# Patient Record
Sex: Male | Born: 1975
Health system: Southern US, Community
[De-identification: ages and names within clinical notes are randomized; demographics above are authoritative.]

## PROBLEM LIST (undated history)

## (undated) DIAGNOSIS — I10 Essential (primary) hypertension: Secondary | ICD-10-CM

---

## 2009-11-17 ENCOUNTER — Emergency Department: Payer: Self-pay | Admitting: Emergency Medicine

## 2009-11-20 ENCOUNTER — Emergency Department: Payer: Self-pay | Admitting: Emergency Medicine

## 2011-05-11 ENCOUNTER — Emergency Department (HOSPITAL_COMMUNITY)
Admission: EM | Admit: 2011-05-11 | Discharge: 2011-05-11 | Disposition: A | Discharge status: Home / Self Care | Payer: PRIVATE HEALTH INSURANCE | Attending: Emergency Medicine | Admitting: Emergency Medicine

## 2011-05-11 DIAGNOSIS — M25469 Effusion, unspecified knee: Secondary | ICD-10-CM | POA: Insufficient documentation

## 2011-05-11 DIAGNOSIS — M25569 Pain in unspecified knee: Secondary | ICD-10-CM | POA: Insufficient documentation

## 2011-06-07 ENCOUNTER — Emergency Department (HOSPITAL_COMMUNITY)
Admission: EM | Admit: 2011-06-07 | Discharge: 2011-06-08 | Disposition: A | Discharge status: Home / Self Care | Payer: PRIVATE HEALTH INSURANCE | Attending: Emergency Medicine | Admitting: Emergency Medicine

## 2011-06-07 ENCOUNTER — Encounter: Payer: Self-pay | Admitting: Emergency Medicine

## 2011-06-07 ENCOUNTER — Emergency Department (HOSPITAL_COMMUNITY): Payer: PRIVATE HEALTH INSURANCE

## 2011-06-07 DIAGNOSIS — S60229A Contusion of unspecified hand, initial encounter: Secondary | ICD-10-CM | POA: Insufficient documentation

## 2011-06-07 DIAGNOSIS — M79609 Pain in unspecified limb: Secondary | ICD-10-CM | POA: Insufficient documentation

## 2011-06-07 DIAGNOSIS — M7989 Other specified soft tissue disorders: Secondary | ICD-10-CM | POA: Insufficient documentation

## 2011-06-07 DIAGNOSIS — IMO0002 Reserved for concepts with insufficient information to code with codable children: Secondary | ICD-10-CM | POA: Insufficient documentation

## 2011-06-07 DIAGNOSIS — S60221A Contusion of right hand, initial encounter: Secondary | ICD-10-CM

## 2011-06-07 MED ORDER — IBUPROFEN 800 MG PO TABS
800.0000 mg | ORAL_TABLET | Freq: Once | ORAL | Status: AC
  Administered 2011-06-07: 800 mg via ORAL
  Filled 2011-06-07: qty 1

## 2011-06-07 NOTE — ED Notes (Signed)
PT. REPORTS RIGHT HAND PAIN -PUNCHED A WALL THIS EVENING , PRESENTS WITH PAIN AND SWELLING.

## 2011-06-07 NOTE — ED Provider Notes (Signed)
History     CSN: [id number] Arrival date & time: 06/07/2011 11:11 PM   First MD Initiated Contact with Patient 06/07/11 2341      Chief Complaint  Patient presents with  . Hand Pain    (Consider location/radiation/quality/duration/timing/severity/associated sxs/prior treatment) HPI Patient presents with a complaint of pain in his right hand after punching a wall tonight. He denies hitting another person and does not have any bleeding or abrasion to the hand. The injury happened earlier tonight. He has not tried anything for the pain. He denies any other injury. Palpation and movement makes his pain worse. He does not have any numbness and is able to move all his fingers. There no other associated systemic symptoms.  History reviewed. No pertinent past medical history.  History reviewed. No pertinent past surgical history.  No family history on file.  History  Substance Use Topics  . Smoking status: Not on file  . Smokeless tobacco: Not on file  . Alcohol Use: Not on file      Review of Systems ROS reviewed and otherwise negative except for mentioned in HPI  Allergies  Sulfa antibiotics  Home Medications   Current Outpatient Rx  Name Route Sig Dispense Refill  . OMEGA-3 FATTY ACIDS 1000 MG PO CAPS Oral Take 1 g by mouth 2 (two) times daily.        BP 183/107  Pulse 101  Temp(Src) 98.2 F (36.8 C) (Oral)  Resp 20  SpO2 98% Vitals reviewed- hypertensive, tachycardic Physical Exam Physical Examination: General appearance - alert, well appearing, and in no distress Mental status - alert, oriented to person, place, and time Neck - supple, no midline tenderness Chest - clear to auscultation, no wheezes, rales or rhonchi, symmetric air entry Heart - normal rate, regular rhythm, normal S1, S2, no murmurs, rubs, clicks or gallops Abdomen - soft, nontender, nondistended Back exam - no midline tenderness to palpation Neurological - sensation intact distally, FROM of  fingers in flexion and extension Musculoskeletal - ttp over 5th metatarsal diffusely with soft tissue swelling, no ttp over wrist, no snuffbox tenderness, no ttp and FROM of elbow Extremities - peripheral pulses normal, no deformity Skin - normal coloration and turgor, no abrasions, no ecchymosis or lacerations  ED Course  Procedures (including critical care time)  Labs Reviewed - No data to display Dg Hand Complete Right  06/07/2011  *RADIOLOGY REPORT*  Clinical Data: Right hand pain, swelling.  RIGHT HAND - COMPLETE 3+ VIEW  Comparison: None.  Findings: There is soft tissue swelling overlying the ulnar aspect of the hand at the level of the metacarpals.  Radial and volar angulation of the distal aspect fifth metacarpal.  No acute fracture identified.  No dislocation.  IMPRESSION: Soft tissue swelling along the ulnar aspect of the hand.  Remote appearing fifth metacarpal boxer fracture.  No acute fracture or dislocation identified.  Original Report Authenticated By: Waneta Martins, M.D.     1. Contusion of right hand       MDM  Patient with tenderness and swelling over his right fifth metametacarpal after punching a wall. X-rays revealed a remote fracture of the fifth metacarpal which patient agrees occurred approximately one year ago. There is no new fracture on x-ray. X-ray was reviewed by me as well. Fingers and hand are distally neurovascularly intact. Recommended ice, elevation, ibuprofen for discomfort. Patient given strict return precautions and he is agreeable with this plan. He was notified of his elevated blood pressure and advised to  followup with his primary care doctor to have his blood pressure rechecked.       Ethelda Chick, MD 06/08/11 [phone]

## 2011-06-08 NOTE — ED Notes (Signed)
Pt shows no neuro deficits

## 2012-10-13 IMAGING — CR DG HAND COMPLETE 3+V*R*
3 series · 3 of 3 positions shown · non-contrast
Comparison: None.

CLINICAL DATA: Right hand pain, swelling.

RIGHT HAND - COMPLETE 3+ VIEW

[x hand pa right]
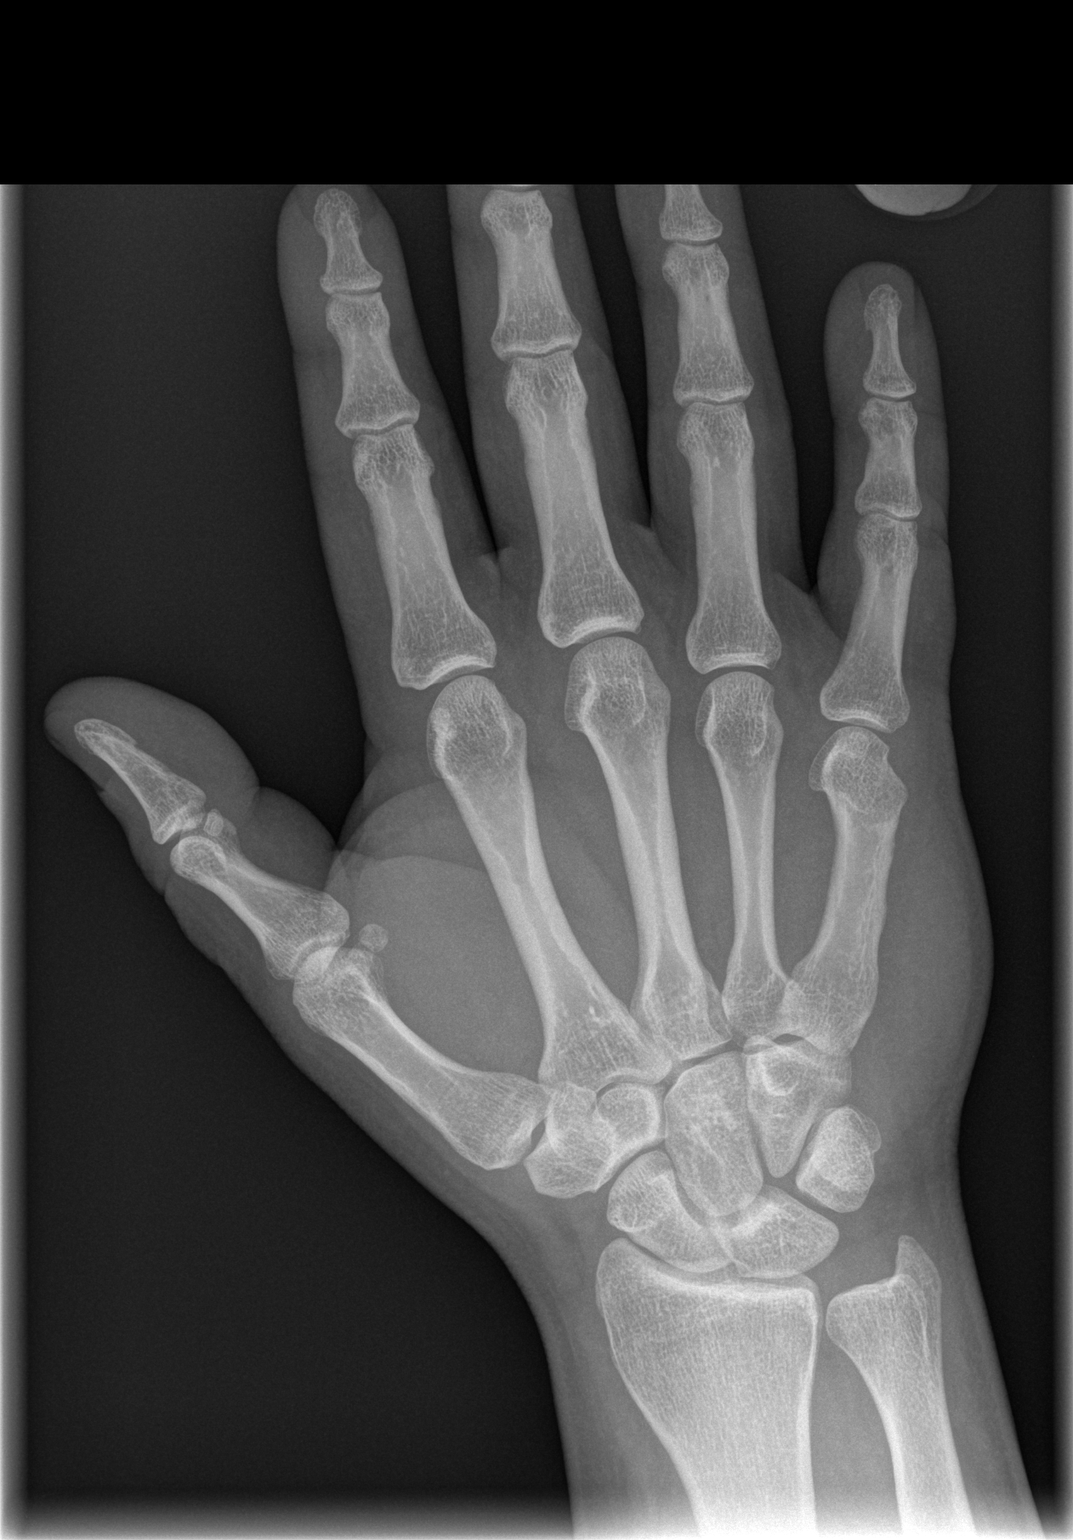

[x hand oblique right]
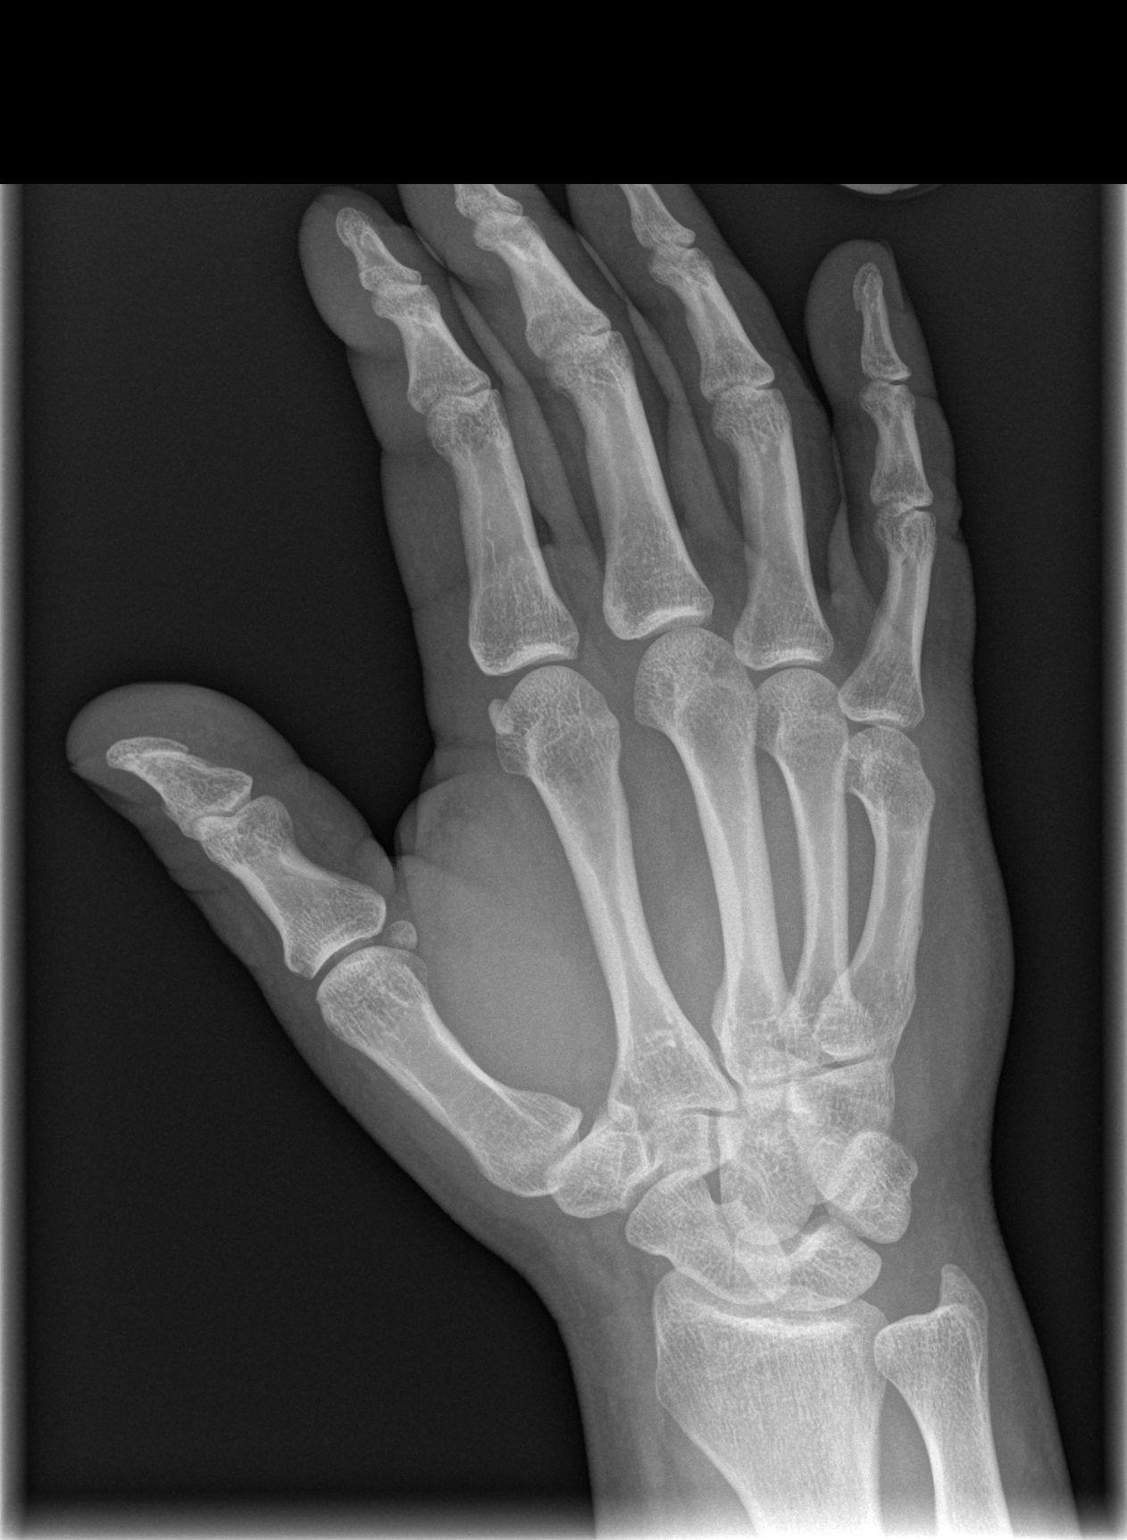

[x hand lat right]
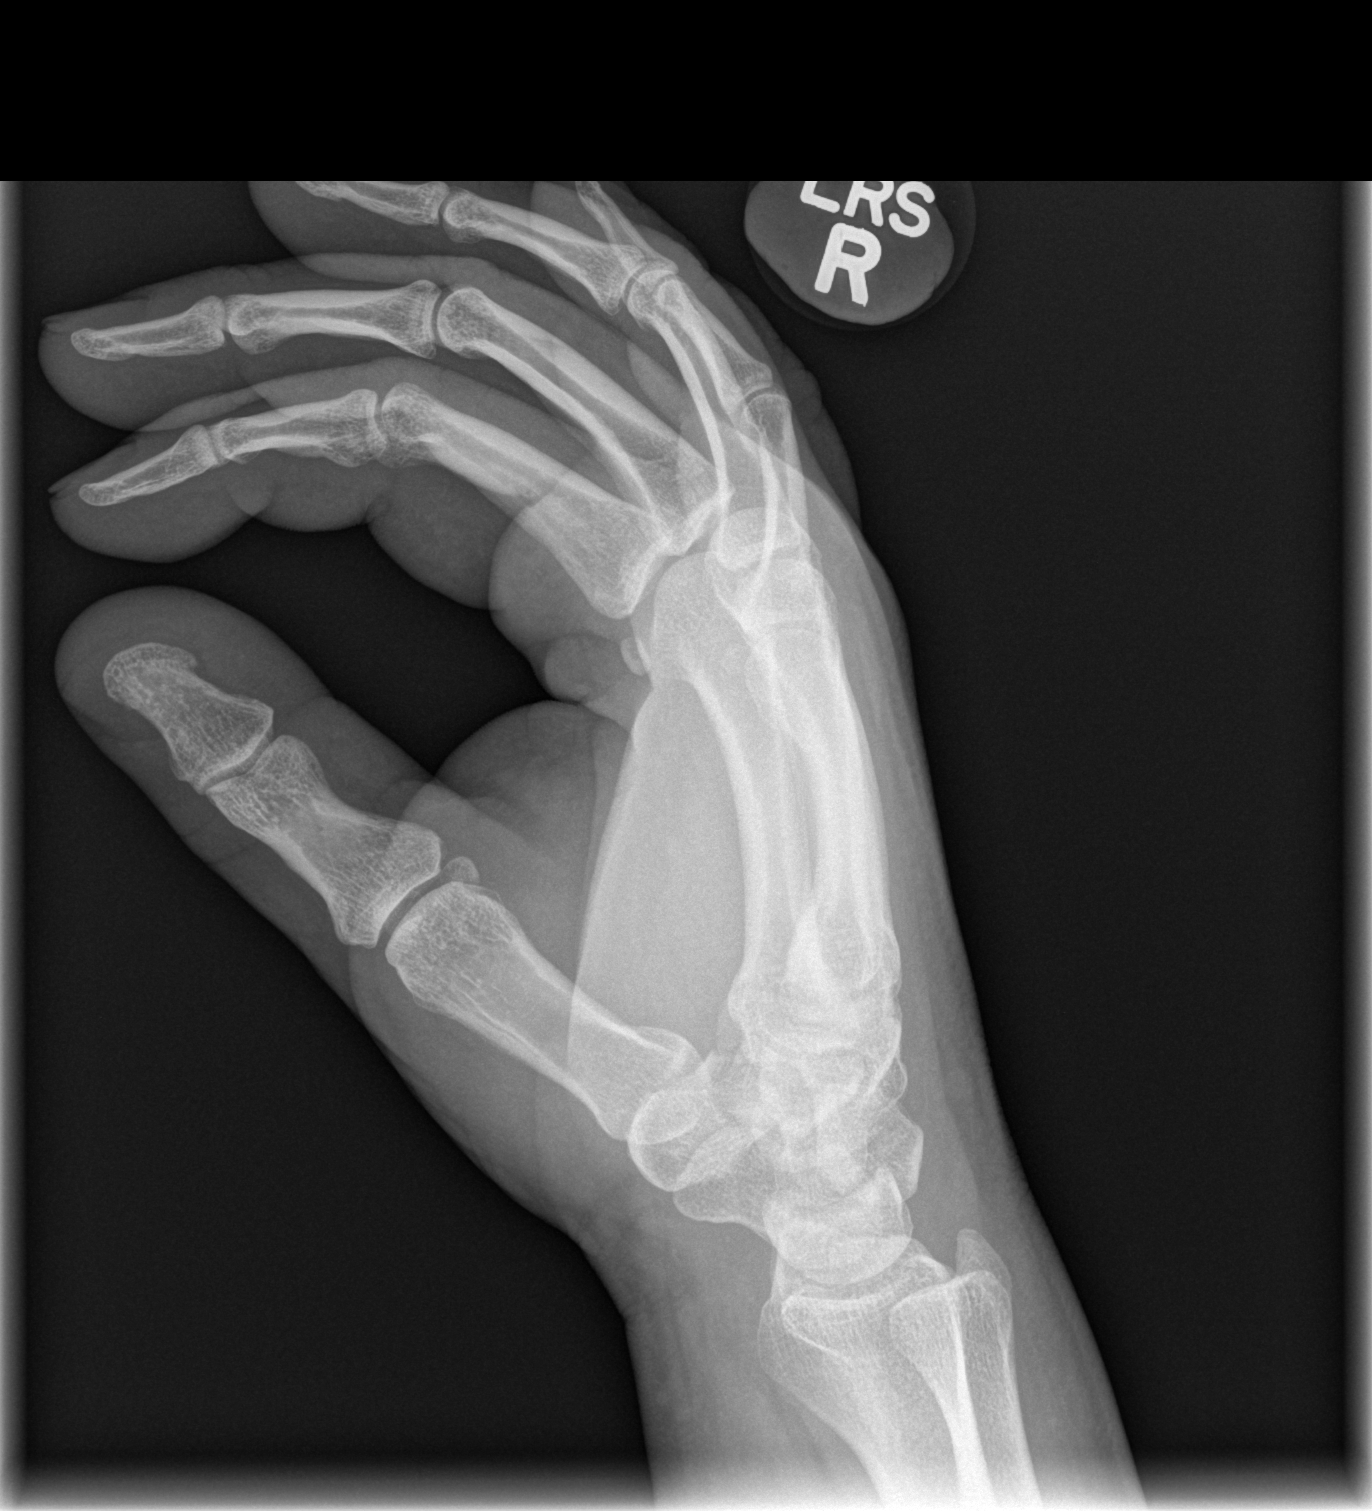

[3 of 3 positions shown; findings below may reference images not displayed]

FINDINGS: There is soft tissue swelling overlying the ulnar aspect
of the hand at the level of the metacarpals.  Radial and volar
angulation of the distal aspect fifth metacarpal.  No acute
fracture identified.  No dislocation.
IMPRESSION: Soft tissue swelling along the ulnar aspect of the
hand.

Remote appearing fifth metacarpal boxer fracture.  No acute
fracture or dislocation identified.

## 2012-12-24 ENCOUNTER — Ambulatory Visit
Admission: RE | Admit: 2012-12-24 | Discharge: 2012-12-24 | Disposition: A | Discharge status: Home / Self Care | Payer: BC Managed Care – PPO | Source: Ambulatory Visit | Attending: Physician Assistant | Admitting: Physician Assistant

## 2012-12-24 ENCOUNTER — Other Ambulatory Visit: Payer: Self-pay | Admitting: Physician Assistant

## 2012-12-24 DIAGNOSIS — M2550 Pain in unspecified joint: Secondary | ICD-10-CM

## 2013-11-23 ENCOUNTER — Encounter (HOSPITAL_COMMUNITY): Payer: Self-pay | Admitting: Emergency Medicine

## 2013-11-23 ENCOUNTER — Emergency Department (HOSPITAL_COMMUNITY)
Admission: EM | Admit: 2013-11-23 | Discharge: 2013-11-23 | Disposition: A | Discharge status: Home / Self Care | Payer: BC Managed Care – PPO | Attending: Emergency Medicine | Admitting: Emergency Medicine

## 2013-11-23 DIAGNOSIS — I10 Essential (primary) hypertension: Secondary | ICD-10-CM | POA: Insufficient documentation

## 2013-11-23 DIAGNOSIS — Z79899 Other long term (current) drug therapy: Secondary | ICD-10-CM | POA: Insufficient documentation

## 2013-11-23 DIAGNOSIS — L03119 Cellulitis of unspecified part of limb: Principal | ICD-10-CM

## 2013-11-23 DIAGNOSIS — L0291 Cutaneous abscess, unspecified: Secondary | ICD-10-CM

## 2013-11-23 DIAGNOSIS — L02419 Cutaneous abscess of limb, unspecified: Secondary | ICD-10-CM | POA: Insufficient documentation

## 2013-11-23 DIAGNOSIS — F17200 Nicotine dependence, unspecified, uncomplicated: Secondary | ICD-10-CM | POA: Insufficient documentation

## 2013-11-23 HISTORY — DX: Essential (primary) hypertension: I10

## 2013-11-23 MED ORDER — ONDANSETRON HCL 4 MG PO TABS
4.0000 mg | ORAL_TABLET | Freq: Once | ORAL | Status: AC
  Administered 2013-11-23: 4 mg via ORAL
  Filled 2013-11-23: qty 1

## 2013-11-23 MED ORDER — IBUPROFEN 800 MG PO TABS
800.0000 mg | ORAL_TABLET | Freq: Once | ORAL | Status: AC
  Administered 2013-11-23: 800 mg via ORAL
  Filled 2013-11-23: qty 1

## 2013-11-23 MED ORDER — DOXYCYCLINE HYCLATE 100 MG PO CAPS: 100.0000 mg | ORAL_CAPSULE | Freq: Two times a day (BID) | ORAL | Status: AC

## 2013-11-23 MED ORDER — IBUPROFEN 800 MG PO TABS
800.0000 mg | ORAL_TABLET | Freq: Three times a day (TID) | ORAL | Status: DC
Start: 2013-11-23 — End: 2019-08-17

## 2013-11-23 MED ORDER — HYDROCODONE-ACETAMINOPHEN 5-325 MG PO TABS
2.0000 | ORAL_TABLET | Freq: Once | ORAL | Status: AC
  Administered 2013-11-23: 2 via ORAL
  Filled 2013-11-23: qty 2

## 2013-11-23 MED ORDER — HYDROCODONE-ACETAMINOPHEN 5-325 MG PO TABS: ORAL_TABLET | ORAL | Status: DC

## 2013-11-23 MED ORDER — DOXYCYCLINE HYCLATE 100 MG PO TABS
100.0000 mg | ORAL_TABLET | Freq: Once | ORAL | Status: AC
  Administered 2013-11-23: 100 mg via ORAL
  Filled 2013-11-23: qty 1

## 2013-11-23 NOTE — ED Notes (Signed)
Pt with abscess to R groin area. States it has started to drain some.

## 2013-11-23 NOTE — Discharge Instructions (Signed)
Please soak in a tub of warm Epsom salt water 2 times daily. Please use doxycycline and ibuprofen 800 mg daily. Use Norco for pain if needed. Norco may cause drowsiness, please use with caution. Please see your primary physician, or return to the emergency department if the abscess is getting larger, or there is fever that is not controlled by ibuprofen or Tylenol. Abscess An abscess (boil or furuncle) is an infected area on or under the skin. This area is filled with yellowish-white fluid (pus) and other material (debris). HOME CARE   Only take medicines as told by your doctor.  If you were given antibiotic medicine, take it as directed. Finish the medicine even if you start to feel better.  If gauze is used, follow your doctor's directions for changing the gauze.  To avoid spreading the infection:  Keep your abscess covered with a bandage.  Wash your hands well.  Do not share personal care items, towels, or whirlpools with others.  Avoid skin contact with others.  Keep your skin and clothes clean around the abscess.  Keep all doctor visits as told. GET HELP RIGHT AWAY IF:   You have more pain, puffiness (swelling), or redness in the wound site.  You have more fluid or blood coming from the wound site.  You have muscle aches, chills, or you feel sick.  You have a fever. MAKE SURE YOU:   Understand these instructions.  Will watch your condition.  Will get help right away if you are not doing well or get worse. Document Released: 12/19/2007 Document Revised: 01/01/2012 Document Reviewed: 09/14/2011 [facility] Patient Information 2014 [employer], [place].

## 2013-11-23 NOTE — ED Provider Notes (Signed)
CSN: [id number]     Arrival date & time 11/23/13  1907 History   First MD Initiated Contact with Patient 11/23/13 2118     Chief Complaint  Patient presents with  . Abscess     (Consider location/radiation/quality/duration/timing/severity/associated sxs/prior Treatment) Patient is a 38 y.o. male presenting with abscess. The history is provided by the patient.  Abscess Location:  Leg Leg abscess location:  L leg Abscess quality: painful, redness and warmth   Abscess quality: not draining and not weeping   Red streaking: no   Duration:  3 days Progression:  Worsening Pain details:    Quality:  Aching   Severity:  Moderate   Duration:  3 days   Timing:  Intermittent   Progression:  Worsening Chronicity:  New Context: not diabetes and not insect bite/sting   Relieved by:  Nothing Worsened by:  Nothing tried Ineffective treatments:  Warm compresses Associated symptoms: no fatigue, no fever, no nausea and no vomiting   Risk factors: prior abscess     Past Medical History  Diagnosis Date  . Hypertension    History reviewed. No pertinent past surgical history. History reviewed. No pertinent family history. History  Substance Use Topics  . Smoking status: Current Every Day Smoker  . Smokeless tobacco: Not on file  . Alcohol Use: Yes    Review of Systems  Constitutional: Negative for fever, activity change and fatigue.       All ROS Neg except as noted in HPI  HENT: Negative for nosebleeds.   Eyes: Negative for photophobia and discharge.  Respiratory: Negative for cough, shortness of breath and wheezing.   Cardiovascular: Negative for chest pain and palpitations.  Gastrointestinal: Negative for nausea, vomiting, abdominal pain and blood in stool.  Genitourinary: Negative for dysuria, frequency and hematuria.  Musculoskeletal: Negative for arthralgias, back pain and neck pain.  Skin: Negative.   Neurological: Negative for dizziness, seizures and speech difficulty.    Psychiatric/Behavioral: Negative for hallucinations and confusion.      Allergies  Sulfa antibiotics  Home Medications   Prior to Admission medications   Medication Sig Start Date End Date Taking? Authorizing Provider  fish oil-omega-3 fatty acids 1000 MG capsule Take 1 g by mouth 2 (two) times daily.      Historical Provider, MD   BP 121/73  Pulse 86  Temp(Src) 98.8 F (37.1 C) (Oral)  Resp 20  Ht 5\' 11"  (1.803 m)  Wt 275 lb (124.739 kg)  BMI 38.37 kg/m2  SpO2 98% Physical Exam  Nursing note and vitals reviewed. Constitutional: He is oriented to person, place, and time. He appears well-developed and well-nourished.  Non-toxic appearance.  HENT:  Head: Normocephalic.  Right Ear: Tympanic membrane and external ear normal.  Left Ear: Tympanic membrane and external ear normal.  Eyes: EOM and lids are normal. Pupils are equal, round, and reactive to light.  Neck: Normal range of motion. Neck supple. Carotid bruit is not present.  Cardiovascular: Normal rate, regular rhythm, normal heart sounds, intact distal pulses and normal pulses.   Pulmonary/Chest: Breath sounds normal. No respiratory distress.  Abdominal: Soft. Bowel sounds are normal. There is no tenderness. There is no guarding.  Genitourinary:     No scrotal or rectal involvement from the abscess.  Musculoskeletal: Normal range of motion.  Lymphadenopathy:       Head (right side): No submandibular adenopathy present.       Head (left side): No submandibular adenopathy present.    He has no cervical  adenopathy.  Neurological: He is alert and oriented to person, place, and time. He has normal strength. No cranial nerve deficit or sensory deficit.  Skin: Skin is warm and dry.  Psychiatric: He has a normal mood and affect. His speech is normal.    ED Course  Procedures (including critical care time) Labs Review Labs Reviewed - No data to display  Imaging Review No results found.   EKG Interpretation None       MDM Pt has an abscess of the left inner thigh. No red streaking. No fever. This abscess is not a candidate for I and D. Rx for doxycycline, norco, and ibuprofen given to the patient. Pt to return the abscess is progressing.   Final diagnoses:  None    **I have reviewed nursing notes, vital signs, and all appropriate lab and imaging results for this patient.Kathie Dike*    Treva Huyett M Rebekah Zackery, PA-C 11/25/13 1932

## 2013-11-23 NOTE — ED Notes (Signed)
Abscess to lt thigh for 3 days

## 2013-11-25 NOTE — ED Notes (Signed)
Pt called and stated he needs a work note. Given a note to return 11/25/13

## 2013-11-26 NOTE — ED Provider Notes (Signed)
Medical screening examination/treatment/procedure(s) were performed by non-physician practitioner and as supervising physician I was immediately available for consultation/collaboration.   EKG Interpretation None       Yatziri Wainwright, MD 11/26/13 0207 

## 2018-06-26 ENCOUNTER — Other Ambulatory Visit: Payer: Self-pay

## 2018-06-26 ENCOUNTER — Encounter (HOSPITAL_COMMUNITY): Payer: Self-pay

## 2018-06-26 ENCOUNTER — Emergency Department (HOSPITAL_COMMUNITY)
Admission: EM | Admit: 2018-06-26 | Discharge: 2018-06-26 | Disposition: A | Discharge status: Home / Self Care | Payer: Self-pay | Attending: Emergency Medicine | Admitting: Emergency Medicine

## 2018-06-26 DIAGNOSIS — F17210 Nicotine dependence, cigarettes, uncomplicated: Secondary | ICD-10-CM | POA: Insufficient documentation

## 2018-06-26 DIAGNOSIS — Y929 Unspecified place or not applicable: Secondary | ICD-10-CM | POA: Insufficient documentation

## 2018-06-26 DIAGNOSIS — Y999 Unspecified external cause status: Secondary | ICD-10-CM | POA: Insufficient documentation

## 2018-06-26 DIAGNOSIS — S6710XA Crushing injury of unspecified finger(s), initial encounter: Secondary | ICD-10-CM

## 2018-06-26 DIAGNOSIS — I10 Essential (primary) hypertension: Secondary | ICD-10-CM | POA: Insufficient documentation

## 2018-06-26 DIAGNOSIS — S67194A Crushing injury of right ring finger, initial encounter: Secondary | ICD-10-CM | POA: Insufficient documentation

## 2018-06-26 DIAGNOSIS — Y939 Activity, unspecified: Secondary | ICD-10-CM | POA: Insufficient documentation

## 2018-06-26 DIAGNOSIS — W231XXA Caught, crushed, jammed, or pinched between stationary objects, initial encounter: Secondary | ICD-10-CM | POA: Insufficient documentation

## 2018-06-26 DIAGNOSIS — S61214A Laceration without foreign body of right ring finger without damage to nail, initial encounter: Secondary | ICD-10-CM | POA: Insufficient documentation

## 2018-06-26 MED ORDER — LIDOCAINE HCL (PF) 1 % IJ SOLN
5.0000 mL | Freq: Once | INTRAMUSCULAR | Status: AC
  Administered 2018-06-26: 5 mL
  Filled 2018-06-26: qty 5

## 2018-06-26 MED ORDER — BUPIVACAINE HCL (PF) 0.5 % IJ SOLN
10.0000 mL | Freq: Once | INTRAMUSCULAR | Status: AC
  Administered 2018-06-26: 10 mL
  Filled 2018-06-26: qty 10

## 2018-06-26 NOTE — ED Notes (Signed)
Pt is soaking finger in sterile water / hydrogen peroxide solution.

## 2018-06-26 NOTE — ED Notes (Signed)
ED Provider at bedside. 

## 2018-06-26 NOTE — ED Provider Notes (Signed)
[facility] Provider Note   CSN: [id number] Arrival date & time: 06/26/18  1557     History   Chief Complaint Chief Complaint  Patient presents with  . Finger Injury    HPI Ivan Mcmahon is a 42 y.o. male.  He is complaining of an injury to his right ring finger when a broom broke and pinched his finger causing a tear in the pad of the skin.  This occurred just prior to arrival.  No foreign body sensation.  Tetanus is up-to-date.  He is complaining of moderate amount of pain once palpated but otherwise does not feel like it is broken.  No other injuries or complaints.  The history is provided by the patient.  Hand Pain  This is a new problem. The current episode started 1 to 2 hours ago. The problem occurs constantly. The problem has not changed since onset.Pertinent negatives include no chest pain, no abdominal pain, no headaches and no shortness of breath. The symptoms are aggravated by bending. The symptoms are relieved by position. He has tried nothing for the symptoms. The treatment provided no relief.    Past Medical History:  Diagnosis Date  . Hypertension     There are no active problems to display for this patient.   History reviewed. No pertinent surgical history.      Home Medications    Prior to Admission medications   Medication Sig Start Date End Date Taking? Authorizing Provider  fish oil-omega-3 fatty acids 1000 MG capsule Take 1 g by mouth 2 (two) times daily.      [provider]  HYDROcodone-acetaminophen (NORCO/VICODIN) 5-325 MG per tablet 1 or 2 po q4h prn pain 11/23/13   Ivery QualeBryant, Hobson, PA-C  ibuprofen (ADVIL,MOTRIN) 800 MG tablet Take 1 tablet (800 mg total) by mouth 3 (three) times daily. 11/23/13   Ivery QualeBryant, Hobson, PA-C    Family History No family history on file.  Social History Social History   Tobacco Use  . Smoking status: Current Every Day Smoker  Substance Use Topics  . Alcohol use: Yes    . Drug use: No     Allergies   Sulfa antibiotics   Review of Systems Review of Systems  Respiratory: Negative for shortness of breath.   Cardiovascular: Negative for chest pain.  Gastrointestinal: Negative for abdominal pain.  Skin: Positive for wound.  Neurological: Negative for numbness and headaches.     Physical Exam Updated Vital Signs BP (!) 179/120 (BP Location: Left Arm)   Pulse (!) 108   Temp 97.9 F (36.6 C) (Oral)   Resp 18   SpO2 98%   Physical Exam Vitals signs and nursing note reviewed.  Constitutional:      Appearance: He is well-developed.  HENT:     Head: Normocephalic and atraumatic.  Eyes:     Conjunctiva/sclera: Conjunctivae normal.  Neck:     Musculoskeletal: Neck supple.  Pulmonary:     Effort: Pulmonary effort is normal.  Musculoskeletal:     Comments: He is an injury to his right ring finger.  FDS FDP and EDC intact.  Cap refill brisk.  Sensation to light touch intact.  He is got a flap laceration over the distal phalanx pad side  Skin:    General: Skin is warm and dry.  Neurological:     Mental Status: He is alert.     GCS: GCS eye subscore is 4. GCS verbal subscore is 5. GCS motor subscore is  6.      ED Treatments / Results  Labs (all labs ordered are listed, but only abnormal results are displayed) Labs Reviewed - No data to display  EKG None  Radiology No results found.  Procedures .Marland KitchenLaceration Repair Date/Time: 06/26/2018 6:19 PM Performed by: Terrilee Files, MD Authorized by: Terrilee Files, MD   Consent:    Consent obtained:  Verbal   Consent given by:  Patient   Risks discussed:  Infection, pain, poor cosmetic result, poor wound healing and retained foreign body   Alternatives discussed:  No treatment and delayed treatment Anesthesia (see MAR for exact dosages):    Anesthesia method:  Nerve block   Block needle gauge:  25 G   Block anesthetic:  Lidocaine 1% w/o epi and bupivacaine 0.5% w/o epi   Block  technique:  Digital   Block outcome:  Incomplete block Laceration details:    Location:  Finger   Finger location:  R ring finger   Length (cm):  3 Comments:     Unable to achieve enough anesthesia to proceed with suture repair.  Patient ultimately asked to just have it butterflied so he can go.   (including critical care time)  Medications Ordered in ED Medications - No data to display   Initial Impression / Assessment and Plan / ED Course  I have reviewed the triage vital signs and the nursing notes.  Pertinent labs & imaging results that were available during my care of the patient were reviewed by me and considered in my medical decision making (see chart for details).      Final Clinical Impressions(s) / ED Diagnoses   Final diagnoses:  Crushing injury of finger, initial encounter  Laceration of right ring finger without foreign body without damage to nail, initial encounter  Hypertension, unspecified type    ED Discharge Orders    None       Terrilee Files, MD 06/27/18 1048

## 2018-06-26 NOTE — Discharge Instructions (Addendum)
Seen in the emergency department for laceration to your ring finger.  We tried to numb it up enough to do stitches but it never really worked.  We applied some butterfly stitches and you will need to keep this area clean and dry.  You can wash it with soap and water.  Watch for signs of infection.  Tylenol and ibuprofen for pain.  Your blood pressure was also elevated here and will be important for you to get this rechecked by your primary care doctor.

## 2018-06-26 NOTE — ED Triage Notes (Signed)
Pt reports a broom breaking while sweeping and cutting his left ring finger, bleeding controlled at this time. UTD on tetanus

## 2018-11-21 DIAGNOSIS — H20042 Secondary noninfectious iridocyclitis, left eye: Secondary | ICD-10-CM | POA: Diagnosis not present

## 2018-11-21 DIAGNOSIS — S0502XA Injury of conjunctiva and corneal abrasion without foreign body, left eye, initial encounter: Secondary | ICD-10-CM | POA: Diagnosis not present

## 2018-11-21 DIAGNOSIS — H5712 Ocular pain, left eye: Secondary | ICD-10-CM | POA: Diagnosis not present

## 2018-11-24 DIAGNOSIS — S0502XA Injury of conjunctiva and corneal abrasion without foreign body, left eye, initial encounter: Secondary | ICD-10-CM | POA: Diagnosis not present

## 2018-11-24 DIAGNOSIS — H5712 Ocular pain, left eye: Secondary | ICD-10-CM | POA: Diagnosis not present

## 2018-11-24 DIAGNOSIS — H20042 Secondary noninfectious iridocyclitis, left eye: Secondary | ICD-10-CM | POA: Diagnosis not present

## 2019-07-21 ENCOUNTER — Ambulatory Visit: Payer: Self-pay | Admitting: Family Medicine

## 2019-08-10 ENCOUNTER — Telehealth: Payer: Self-pay

## 2019-08-10 NOTE — Telephone Encounter (Signed)
Patient was not able to come at the other times and he forgot to ask off from work for this appointment so rescheduled patient to a different day

## 2019-08-10 NOTE — Telephone Encounter (Signed)
Left message on voicemail to see if pt could come in at 10:45 or 3:45 instead of 12:15 due to slot not being appropriate for New pt.... for now I will block the 10:45am

## 2019-08-11 ENCOUNTER — Ambulatory Visit: Payer: Self-pay | Admitting: Internal Medicine

## 2019-08-17 ENCOUNTER — Other Ambulatory Visit: Payer: Self-pay

## 2019-08-17 ENCOUNTER — Ambulatory Visit (INDEPENDENT_AMBULATORY_CARE_PROVIDER_SITE_OTHER): Payer: BC Managed Care – PPO | Admitting: Internal Medicine

## 2019-08-17 ENCOUNTER — Encounter: Payer: Self-pay | Admitting: Internal Medicine

## 2019-08-17 VITALS — BP 178/108 | HR 100 | Temp 97.9°F | Wt 304.0 lb

## 2019-08-17 DIAGNOSIS — I10 Essential (primary) hypertension: Secondary | ICD-10-CM | POA: Diagnosis not present

## 2019-08-17 DIAGNOSIS — Z716 Tobacco abuse counseling: Secondary | ICD-10-CM

## 2019-08-17 DIAGNOSIS — M1A0790 Idiopathic chronic gout, unspecified ankle and foot, without tophus (tophi): Secondary | ICD-10-CM

## 2019-08-17 DIAGNOSIS — Z833 Family history of diabetes mellitus: Secondary | ICD-10-CM | POA: Diagnosis not present

## 2019-08-17 DIAGNOSIS — Z6841 Body Mass Index (BMI) 40.0 and over, adult: Secondary | ICD-10-CM

## 2019-08-17 LAB — HEMOGLOBIN A1C: Hgb A1c MFr Bld: 6.1 % (ref 4.6–6.5)

## 2019-08-17 LAB — CBC
HCT: 45.6 % (ref 39.0–52.0)
Hemoglobin: 15.2 g/dL (ref 13.0–17.0)
MCHC: 33.5 g/dL (ref 30.0–36.0)
MCV: 93.7 fl (ref 78.0–100.0)
Platelets: 343 10*3/uL (ref 150.0–400.0)
RBC: 4.87 Mil/uL (ref 4.22–5.81)
RDW: 13.3 % (ref 11.5–15.5)
WBC: 11.7 10*3/uL — ABNORMAL HIGH (ref 4.0–10.5)

## 2019-08-17 LAB — URIC ACID: Uric Acid, Serum: 8.8 mg/dL — ABNORMAL HIGH (ref 4.0–7.8)

## 2019-08-17 LAB — COMPREHENSIVE METABOLIC PANEL
ALT: 36 U/L (ref 0–53)
AST: 27 U/L (ref 0–37)
Albumin: 4.5 g/dL (ref 3.5–5.2)
Alkaline Phosphatase: 98 U/L (ref 39–117)
BUN: 13 mg/dL (ref 6–23)
CO2: 29 mEq/L (ref 19–32)
Calcium: 9.8 mg/dL (ref 8.4–10.5)
Chloride: 101 mEq/L (ref 96–112)
Creatinine, Ser: 0.92 mg/dL (ref 0.40–1.50)
GFR: 90.25 mL/min (ref >60.00)
Glucose, Bld: 113 mg/dL — ABNORMAL HIGH (ref 70–99)
Potassium: 4.8 mEq/L (ref 3.5–5.1)
Sodium: 138 mEq/L (ref 135–145)
Total Bilirubin: 0.5 mg/dL (ref 0.2–1.2)
Total Protein: 7.4 g/dL (ref 6.0–8.3)

## 2019-08-17 LAB — LDL CHOLESTEROL, DIRECT: Direct LDL: 169 mg/dL

## 2019-08-17 LAB — LIPID PANEL
Cholesterol: 244 mg/dL — ABNORMAL HIGH (ref 0–200)
HDL: 45.7 mg/dL (ref >39.00)
NonHDL: 197.88
Total CHOL/HDL Ratio: 5
Triglycerides: 301 mg/dL — ABNORMAL HIGH (ref 0.0–149.0)
VLDL: 60.2 mg/dL — ABNORMAL HIGH (ref 0.0–40.0)

## 2019-08-17 MED ORDER — CHANTIX STARTING MONTH PAK 0.5 MG X 11 & 1 MG X 42 PO TABS: ORAL_TABLET | ORAL | 0 refills | Status: DC

## 2019-08-17 MED ORDER — LOSARTAN POTASSIUM-HCTZ 50-12.5 MG PO TABS: 1.0000 | ORAL_TABLET | Freq: Every day | ORAL | 0 refills | Status: DC

## 2019-08-17 NOTE — Assessment & Plan Note (Signed)
CMET today Will start Losartan HCT Reinforced DASH diet and exercise for weight loss  Follow up in 10 days for BP check

## 2019-08-17 NOTE — Assessment & Plan Note (Signed)
Uric acid level today Consider restarting Allopurinol if uric acid elevated

## 2019-08-17 NOTE — Patient Instructions (Signed)

## 2019-08-17 NOTE — Progress Notes (Signed)
HPI  Pt presents to the clinic today to establish care and for management of the conditions listed below. He is transferring care from [place].  HTN: His BP today is 178/100. He was on Lisinopril in the past but has not taken this in a few years. He denies headaches, dizziness or visual changes.  Gout: His last flare was about 6 months. It typically flares in his feet and right wrist. He was on Allopurinol at one point but has not taken in a long time.   He would like something to quit smoking.  Flu: never Tetanus: < 10 years ago Vision Screening: as needed Dentist: as needed  Past Medical History:  Diagnosis Date  . Hypertension     Current Outpatient Medications  Medication Sig Dispense Refill  . fish oil-omega-3 fatty acids 1000 MG capsule Take 1 g by mouth 2 (two) times daily.       No current facility-administered medications for this visit.    Allergies  Allergen Reactions  . Sulfa Antibiotics     UNKNOWN REACTION MANY YEARS AGO    Family History  Problem Relation Age of Onset  . Diabetes Father     Social History   Socioeconomic History  . Marital status: Married    Spouse name: Not on file  . Number of children: Not on file  . Years of education: Not on file  . Highest education level: Not on file  Occupational History  . Not on file  Tobacco Use  . Smoking status: Current Every Day Smoker    Packs/day: 0.33    Types: Cigarettes  . Smokeless tobacco: Never Used  Substance and Sexual Activity  . Alcohol use: Yes    Comment: beers every other day  . Drug use: No  . Sexual activity: Not on file  Other Topics Concern  . Not on file  Social History Narrative  . Not on file   Social Determinants of Health   Financial Resource Strain:   . Difficulty of Paying Living Expenses: Not on file  Food Insecurity:   . Worried About Programme researcher, broadcasting/film/video in the Last Year: Not on file  . Ran Out of Food in the Last Year: Not on file  Transportation Needs:   .  Lack of Transportation (Medical): Not on file  . Lack of Transportation (Non-Medical): Not on file  Physical Activity:   . Days of Exercise per Week: Not on file  . Minutes of Exercise per Session: Not on file  Stress:   . Feeling of Stress : Not on file  Social Connections:   . Frequency of Communication with Friends and Family: Not on file  . Frequency of Social Gatherings with Friends and Family: Not on file  . Attends Religious Services: Not on file  . Active Member of Clubs or Organizations: Not on file  . Attends Banker Meetings: Not on file  . Marital Status: Not on file  Intimate Partner Violence:   . Fear of Current or Ex-Partner: Not on file  . Emotionally Abused: Not on file  . Physically Abused: Not on file  . Sexually Abused: Not on file    ROS:  Constitutional: Denies fever, malaise, fatigue, headache or abrupt weight changes.  HEENT: Denies eye pain, eye redness, ear pain, ringing in the ears, wax buildup, runny nose, nasal congestion, bloody nose, or sore throat. Respiratory: Denies difficulty breathing, shortness of breath, cough or sputum production.   Cardiovascular: Denies chest pain,  chest tightness, palpitations or swelling in the hands or feet.  Gastrointestinal: Denies abdominal pain, bloating, constipation, diarrhea or blood in the stool.  GU: Denies frequency, urgency, pain with urination, blood in urine, odor or discharge. Musculoskeletal: Denies decrease in range of motion, difficulty with gait, muscle pain or joint pain and swelling.  Skin: Denies redness, rashes, lesions or ulcercations.  Neurological: Denies dizziness, difficulty with memory, difficulty with speech or problems with balance and coordination.  Psych: Denies anxiety, depression, SI/HI.  No other specific complaints in a complete review of systems (except as listed in HPI above).  PE:  BP (!) 178/108   Pulse 100   Temp 97.9 F (36.6 C) (Temporal)   Wt (!) 304 lb  (137.9 kg)   SpO2 96%   BMI 42.40 kg/m  Wt Readings from Last 3 Encounters:  08/17/19 (!) 304 lb (137.9 kg)  11/23/13 275 lb (124.7 kg)    General: Appears his stated age, obese, in NAD. Skin: Dry and intact. HEENT: Head: normal shape and size; Eyes: sclera white, no icterus, conjunctiva pink, PERRLA and EOMs intact;  Cardiovascular: Normal rate and rhythm. S1,S2 noted.  No murmur, rubs or gallops noted.  Pulmonary/Chest: Normal effort and positive vesicular breath sounds. No respiratory distress. No wheezes, rales or ronchi noted.  Musculoskeletal: . No difficulty with gait.  Neurological: Alert and oriented.  Psychiatric: Mood and affect normal. Behavior is normal. Judgment and thought content normal.    Assessment and Plan:  Encounter for Smoking Cessation:  Will trial Chantix- discussed common side effects Encouraged him to cut back on drinking while on Chantix Encouraged him to pick a quit date and stick to it  Family Hx of DM 2:  CMET, Lipid and A1C today Encouraged exercise for weight loss  Webb Silversmith, NP This visit occurred during the SARS-CoV-2 public health emergency.  Safety protocols were in place, including screening questions prior to the visit, additional usage of staff PPE, and extensive cleaning of exam room while observing appropriate contact time as indicated for disinfecting solutions.

## 2019-08-26 ENCOUNTER — Telehealth: Payer: Self-pay | Admitting: Internal Medicine

## 2019-08-26 NOTE — Telephone Encounter (Signed)
Patient returned call about his lab results. Please call patient.

## 2019-08-27 ENCOUNTER — Other Ambulatory Visit: Payer: Self-pay

## 2019-08-27 ENCOUNTER — Encounter: Payer: Self-pay | Admitting: Internal Medicine

## 2019-08-27 ENCOUNTER — Ambulatory Visit (INDEPENDENT_AMBULATORY_CARE_PROVIDER_SITE_OTHER): Payer: BC Managed Care – PPO | Admitting: Internal Medicine

## 2019-08-27 VITALS — BP 192/112 | HR 102 | Temp 97.6°F | Wt 305.0 lb

## 2019-08-27 DIAGNOSIS — R0683 Snoring: Secondary | ICD-10-CM | POA: Diagnosis not present

## 2019-08-27 DIAGNOSIS — R5383 Other fatigue: Secondary | ICD-10-CM | POA: Diagnosis not present

## 2019-08-27 DIAGNOSIS — R6882 Decreased libido: Secondary | ICD-10-CM | POA: Diagnosis not present

## 2019-08-27 DIAGNOSIS — Z6841 Body Mass Index (BMI) 40.0 and over, adult: Secondary | ICD-10-CM

## 2019-08-27 DIAGNOSIS — I10 Essential (primary) hypertension: Secondary | ICD-10-CM | POA: Diagnosis not present

## 2019-08-27 MED ORDER — ALLOPURINOL 100 MG PO TABS: 100.0000 mg | ORAL_TABLET | Freq: Every day | ORAL | 2 refills | Status: DC

## 2019-08-27 NOTE — Progress Notes (Signed)
Subjective:    Patient ID: Ivan Mcmahon, male    DOB: [birth year], 44 y.o.   MRN: [id number]  HPI  Pt presents to the clinic today for 10 day follow up HTN. At his last visit, he was started on Losartan HCT but he reports he never picked this medication up. He does reports increased fatigue and decreased libido, not sure if this is related to high blood pressure or not. He has been under a lot of stress lately. He doesn't sleep well, averages 5 hours per sleep at night. He does snore but denies waking up coughing or gasping for air. His BP today is 192/112. There is no ECG on file.   Review of Systems      Past Medical History:  Diagnosis Date  . Hypertension     Current Outpatient Medications  Medication Sig Dispense Refill  . fish oil-omega-3 fatty acids 1000 MG capsule Take 1 g by mouth 2 (two) times daily.      Marland Kitchen losartan-hydrochlorothiazide (HYZAAR) 50-12.5 MG tablet Take 1 tablet by mouth daily. 30 tablet 0  . varenicline (CHANTIX STARTING MONTH PAK) 0.5 MG X 11 & 1 MG X 42 tablet Take one 0.5 mg tablet by mouth once daily for 3 days, then increase to one 0.5 mg tablet twice daily for 4 days, then increase to one 1 mg tablet twice daily. 53 tablet 0   No current facility-administered medications for this visit.    Allergies  Allergen Reactions  . Sulfa Antibiotics     UNKNOWN REACTION MANY YEARS AGO    Family History  Problem Relation Age of Onset  . Diabetes Father     Social History   Socioeconomic History  . Marital status: Married    Spouse name: Not on file  . Number of children: Not on file  . Years of education: Not on file  . Highest education level: Not on file  Occupational History  . Not on file  Tobacco Use  . Smoking status: Current Every Day Smoker    Packs/day: 0.33    Types: Cigarettes  . Smokeless tobacco: Never Used  Substance and Sexual Activity  . Alcohol use: Yes    Comment: beers every other day  . Drug use: No  . Sexual activity:  Not on file  Other Topics Concern  . Not on file  Social History Narrative  . Not on file   Social Determinants of Health   Financial Resource Strain:   . Difficulty of Paying Living Expenses: Not on file  Food Insecurity:   . Worried About Charity fundraiser in the Last Year: Not on file  . Ran Out of Food in the Last Year: Not on file  Transportation Needs:   . Lack of Transportation (Medical): Not on file  . Lack of Transportation (Non-Medical): Not on file  Physical Activity:   . Days of Exercise per Week: Not on file  . Minutes of Exercise per Session: Not on file  Stress:   . Feeling of Stress : Not on file  Social Connections:   . Frequency of Communication with Friends and Family: Not on file  . Frequency of Social Gatherings with Friends and Family: Not on file  . Attends Religious Services: Not on file  . Active Member of Clubs or Organizations: Not on file  . Attends Archivist Meetings: Not on file  . Marital Status: Not on file  Intimate Partner Violence:   .  Fear of Current or Ex-Partner: Not on file  . Emotionally Abused: Not on file  . Physically Abused: Not on file  . Sexually Abused: Not on file     Constitutional: Pt reports fatigue. Denies fever, malaise, headache or abrupt weight changes.  Respiratory: Denies difficulty breathing, shortness of breath, cough or sputum production.   Cardiovascular: Denies chest pain, chest tightness, palpitations or swelling in the hands or feet.  GU: Pt reports decreased libido.  Neurological: Pt reports snoring, Denies dizziness, difficulty with memory, difficulty with speech or problems with balance and coordination.  Psych: Denies anxiety, depression, SI/HI.  No other specific complaints in a complete review of systems (except as listed in HPI above).  Objective:   Physical Exam  BP (!) 192/112   Pulse (!) 102   Temp 97.6 F (36.4 C) (Temporal)   Wt (!) 305 lb (138.3 kg)   SpO2 98%   BMI 42.54  kg/m   Wt Readings from Last 3 Encounters:  08/17/19 (!) 304 lb (137.9 kg)  11/23/13 275 lb (124.7 kg)    General: Appears his stated age, obese, in NAD. Cardiovascular: Tachycardic with normal rhythm. S1,S2 noted.  No murmur, rubs or gallops noted. No JVD or BLE edema.  Pulmonary/Chest: Normal effort and positive vesicular breath sounds. No respiratory distress. No wheezes, rales or ronchi noted.  Musculoskeletal: . No difficulty with gait.  Neurological: Alert and oriented.  Coordination normal.    BMET    Component Value Date/Time   NA 138 08/17/2019 1205   K 4.8 08/17/2019 1205   CL 101 08/17/2019 1205   CO2 29 08/17/2019 1205   GLUCOSE 113 (H) 08/17/2019 1205   BUN 13 08/17/2019 1205   CREATININE 0.92 08/17/2019 1205   CALCIUM 9.8 08/17/2019 1205    Lipid Panel     Component Value Date/Time   CHOL 244 (H) 08/17/2019 1205   TRIG 301.0 (H) 08/17/2019 1205   HDL 45.70 08/17/2019 1205   CHOLHDL 5 08/17/2019 1205   VLDL 60.2 (H) 08/17/2019 1205    CBC    Component Value Date/Time   WBC 11.7 (H) 08/17/2019 1205   RBC 4.87 08/17/2019 1205   HGB 15.2 08/17/2019 1205   HCT 45.6 08/17/2019 1205   PLT 343.0 08/17/2019 1205   MCV 93.7 08/17/2019 1205   MCHC 33.5 08/17/2019 1205   RDW 13.3 08/17/2019 1205    Hgb A1C Lab Results  Component Value Date   HGBA1C 6.1 08/17/2019            Assessment & Plan:  HTN, Fatigue, Decreased Libido, Snoring, Obesity:  Advised him his BP is at a stroke level, and he needs to pick up medication and take as prescribed. Advised him fatigue and libido would likely improve once his BP is better controlled. Reinforced DASH diet, exercise for weight loss Referral to pulm for sleep study  RTC in 10 days for BP followup   Nicki Reaper, NP This visit occurred during the SARS-CoV-2 public health emergency.  Safety protocols were in place, including screening questions prior to the visit, additional usage of staff PPE, and  extensive cleaning of exam room while observing appropriate contact time as indicated for disinfecting solutions.

## 2019-08-28 ENCOUNTER — Encounter: Payer: Self-pay | Admitting: Internal Medicine

## 2019-08-28 NOTE — Patient Instructions (Signed)
DASH Eating Plan DASH stands for "Dietary Approaches to Stop Hypertension." The DASH eating plan is a healthy eating plan that has been shown to reduce high blood pressure (hypertension). It may also reduce your risk for type 2 diabetes, heart disease, and stroke. The DASH eating plan may also help with weight loss. What are tips for following this plan?  General guidelines  Avoid eating more than 2,300 mg (milligrams) of salt (sodium) a day. If you have hypertension, you may need to reduce your sodium intake to 1,500 mg a day.  Limit alcohol intake to no more than 1 drink a day for nonpregnant women and 2 drinks a day for men. One drink equals 12 oz of beer, 5 oz of wine, or 1 oz of hard liquor.  Work with your health care provider to maintain a healthy body weight or to lose weight. Ask what an ideal weight is for you.  Get at least 30 minutes of exercise that causes your heart to beat faster (aerobic exercise) most days of the week. Activities may include walking, swimming, or biking.  Work with your health care provider or diet and nutrition specialist (dietitian) to adjust your eating plan to your individual calorie needs. Reading food labels   Check food labels for the amount of sodium per serving. Choose foods with less than 5 percent of the Daily Value of sodium. Generally, foods with less than 300 mg of sodium per serving fit into this eating plan.  To find whole grains, look for the word "whole" as the first word in the ingredient list. Shopping  Buy products labeled as "low-sodium" or "no salt added."  Buy fresh foods. Avoid canned foods and premade or frozen meals. Cooking  Avoid adding salt when cooking. Use salt-free seasonings or herbs instead of table salt or sea salt. Check with your health care provider or pharmacist before using salt substitutes.  Do not fry foods. Cook foods using healthy methods such as baking, boiling, grilling, and broiling instead.  Cook with  heart-healthy oils, such as olive, canola, soybean, or sunflower oil. Meal planning  Eat a balanced diet that includes: ? 5 or more servings of fruits and vegetables each day. At each meal, try to fill half of your plate with fruits and vegetables. ? Up to 6-8 servings of whole grains each day. ? Less than 6 oz of lean meat, poultry, or fish each day. A 3-oz serving of meat is about the same size as a deck of cards. One egg equals 1 oz. ? 2 servings of low-fat dairy each day. ? A serving of nuts, seeds, or beans 5 times each week. ? Heart-healthy fats. Healthy fats called Omega-3 fatty acids are found in foods such as flaxseeds and coldwater fish, like sardines, salmon, and mackerel.  Limit how much you eat of the following: ? Canned or prepackaged foods. ? Food that is high in trans fat, such as fried foods. ? Food that is high in saturated fat, such as fatty meat. ? Sweets, desserts, sugary drinks, and other foods with added sugar. ? Full-fat dairy products.  Do not salt foods before eating.  Try to eat at least 2 vegetarian meals each week.  Eat more home-cooked food and less restaurant, buffet, and fast food.  When eating at a restaurant, ask that your food be prepared with less salt or no salt, if possible. What foods are recommended? The items listed may not be a complete list. Talk with your dietitian about   what dietary choices are best for you. Grains Whole-grain or whole-wheat bread. Whole-grain or whole-wheat pasta. Brown rice. Oatmeal. Quinoa. Bulgur. Whole-grain and low-sodium cereals. Pita bread. Low-fat, low-sodium crackers. Whole-wheat flour tortillas. Vegetables Fresh or frozen vegetables (raw, steamed, roasted, or grilled). Low-sodium or reduced-sodium tomato and vegetable juice. Low-sodium or reduced-sodium tomato sauce and tomato paste. Low-sodium or reduced-sodium canned vegetables. Fruits All fresh, dried, or frozen fruit. Canned fruit in natural juice (without  added sugar). Meat and other protein foods Skinless chicken or turkey. Ground chicken or turkey. Pork with fat trimmed off. Fish and seafood. Egg whites. Dried beans, peas, or lentils. Unsalted nuts, nut butters, and seeds. Unsalted canned beans. Lean cuts of beef with fat trimmed off. Low-sodium, lean deli meat. Dairy Low-fat (1%) or fat-free (skim) milk. Fat-free, low-fat, or reduced-fat cheeses. Nonfat, low-sodium ricotta or cottage cheese. Low-fat or nonfat yogurt. Low-fat, low-sodium cheese. Fats and oils Soft margarine without trans fats. Vegetable oil. Low-fat, reduced-fat, or light mayonnaise and salad dressings (reduced-sodium). Canola, safflower, olive, soybean, and sunflower oils. Avocado. Seasoning and other foods Herbs. Spices. Seasoning mixes without salt. Unsalted popcorn and pretzels. Fat-free sweets. What foods are not recommended? The items listed may not be a complete list. Talk with your dietitian about what dietary choices are best for you. Grains Baked goods made with fat, such as croissants, muffins, or some breads. Dry pasta or rice meal packs. Vegetables Creamed or fried vegetables. Vegetables in a cheese sauce. Regular canned vegetables (not low-sodium or reduced-sodium). Regular canned tomato sauce and paste (not low-sodium or reduced-sodium). Regular tomato and vegetable juice (not low-sodium or reduced-sodium). Pickles. Olives. Fruits Canned fruit in a light or heavy syrup. Fried fruit. Fruit in cream or butter sauce. Meat and other protein foods Fatty cuts of meat. Ribs. Fried meat. Bacon. Sausage. Bologna and other processed lunch meats. Salami. Fatback. Hotdogs. Bratwurst. Salted nuts and seeds. Canned beans with added salt. Canned or smoked fish. Whole eggs or egg yolks. Chicken or turkey with skin. Dairy Whole or 2% milk, cream, and half-and-half. Whole or full-fat cream cheese. Whole-fat or sweetened yogurt. Full-fat cheese. Nondairy creamers. Whipped toppings.  Processed cheese and cheese spreads. Fats and oils Butter. Stick margarine. Lard. Shortening. Ghee. Bacon fat. Tropical oils, such as coconut, palm kernel, or palm oil. Seasoning and other foods Salted popcorn and pretzels. Onion salt, garlic salt, seasoned salt, table salt, and sea salt. Worcestershire sauce. Tartar sauce. Barbecue sauce. Teriyaki sauce. Soy sauce, including reduced-sodium. Steak sauce. Canned and packaged gravies. Fish sauce. Oyster sauce. Cocktail sauce. Horseradish that you find on the shelf. Ketchup. Mustard. Meat flavorings and tenderizers. Bouillon cubes. Hot sauce and Tabasco sauce. Premade or packaged marinades. Premade or packaged taco seasonings. Relishes. Regular salad dressings. Where to find more information:  National Heart, Lung, and Blood Institute: www.nhlbi.nih.gov  American Heart Association: www.heart.org Summary  The DASH eating plan is a healthy eating plan that has been shown to reduce high blood pressure (hypertension). It may also reduce your risk for type 2 diabetes, heart disease, and stroke.  With the DASH eating plan, you should limit salt (sodium) intake to 2,300 mg a day. If you have hypertension, you may need to reduce your sodium intake to 1,500 mg a day.  When on the DASH eating plan, aim to eat more fresh fruits and vegetables, whole grains, lean proteins, low-fat dairy, and heart-healthy fats.  Work with your health care provider or diet and nutrition specialist (dietitian) to adjust your eating plan to your   individual calorie needs. This information is not intended to replace advice given to you by your health care provider. Make sure you discuss any questions you have with your health care provider. Document Revised: 06/14/2017 Document Reviewed: 06/25/2016 Elsevier Patient Education  2020 Elsevier Inc.  

## 2019-08-31 ENCOUNTER — Ambulatory Visit: Payer: Self-pay | Admitting: Internal Medicine

## 2019-09-07 ENCOUNTER — Ambulatory Visit (INDEPENDENT_AMBULATORY_CARE_PROVIDER_SITE_OTHER): Payer: BC Managed Care – PPO | Admitting: Internal Medicine

## 2019-09-07 ENCOUNTER — Other Ambulatory Visit: Payer: Self-pay

## 2019-09-07 ENCOUNTER — Encounter: Payer: Self-pay | Admitting: Internal Medicine

## 2019-09-07 DIAGNOSIS — I10 Essential (primary) hypertension: Secondary | ICD-10-CM | POA: Diagnosis not present

## 2019-09-07 MED ORDER — LOSARTAN POTASSIUM-HCTZ 100-25 MG PO TABS: 1.0000 | ORAL_TABLET | Freq: Every day | ORAL | 0 refills | Status: DC

## 2019-09-07 NOTE — Patient Instructions (Signed)

## 2019-09-07 NOTE — Progress Notes (Signed)
Subjective:    Patient ID: Ivan Mcmahon, male    DOB: Mar 27, 1976, 44 y.o.   MRN: [id number]  HPI  Pt presents to the clinic today for follow up HTN. At his last visit, he was started on Lisinopril HCT. He has been taking the medication as prescribed and denies adverse side effects. There is no ECG on file. His BP today is 174/96.  Review of Systems      Past Medical History:  Diagnosis Date  . Hypertension     Current Outpatient Medications  Medication Sig Dispense Refill  . allopurinol (ZYLOPRIM) 100 MG tablet Take 1 tablet (100 mg total) by mouth daily. 30 tablet 2  . fish oil-omega-3 fatty acids 1000 MG capsule Take 1 g by mouth 2 (two) times daily.      Marland Kitchen losartan-hydrochlorothiazide (HYZAAR) 50-12.5 MG tablet Take 1 tablet by mouth daily. 30 tablet 0  . varenicline (CHANTIX STARTING MONTH PAK) 0.5 MG X 11 & 1 MG X 42 tablet Take one 0.5 mg tablet by mouth once daily for 3 days, then increase to one 0.5 mg tablet twice daily for 4 days, then increase to one 1 mg tablet twice daily. 53 tablet 0   No current facility-administered medications for this visit.    Allergies  Allergen Reactions  . Sulfa Antibiotics     UNKNOWN REACTION MANY YEARS AGO    Family History  Problem Relation Age of Onset  . Diabetes Father     Social History   Socioeconomic History  . Marital status: Married    Spouse name: Not on file  . Number of children: Not on file  . Years of education: Not on file  . Highest education level: Not on file  Occupational History  . Not on file  Tobacco Use  . Smoking status: Current Every Day Smoker    Packs/day: 0.33    Types: Cigarettes  . Smokeless tobacco: Never Used  Substance and Sexual Activity  . Alcohol use: Yes    Comment: beers every other day  . Drug use: No  . Sexual activity: Not on file  Other Topics Concern  . Not on file  Social History Narrative  . Not on file   Social Determinants of Health   Financial Resource Strain:     . Difficulty of Paying Living Expenses: Not on file  Food Insecurity:   . Worried About Charity fundraiser in the Last Year: Not on file  . Ran Out of Food in the Last Year: Not on file  Transportation Needs:   . Lack of Transportation (Medical): Not on file  . Lack of Transportation (Non-Medical): Not on file  Physical Activity:   . Days of Exercise per Week: Not on file  . Minutes of Exercise per Session: Not on file  Stress:   . Feeling of Stress : Not on file  Social Connections:   . Frequency of Communication with Friends and Family: Not on file  . Frequency of Social Gatherings with Friends and Family: Not on file  . Attends Religious Services: Not on file  . Active Member of Clubs or Organizations: Not on file  . Attends Archivist Meetings: Not on file  . Marital Status: Not on file  Intimate Partner Violence:   . Fear of Current or Ex-Partner: Not on file  . Emotionally Abused: Not on file  . Physically Abused: Not on file  . Sexually Abused: Not on file  Constitutional: Denies fever, malaise, fatigue, headache or abrupt weight changes.  Respiratory: Denies difficulty breathing, shortness of breath, cough or sputum production.   Cardiovascular: Denies chest pain, chest tightness, palpitations or swelling in the hands or feet.  Neurological: Denies dizziness, difficulty with memory, difficulty with speech or problems with balance and coordination.    No other specific complaints in a complete review of systems (except as listed in HPI above).  Objective:   Physical Exam   BP (!) 174/96   Pulse (!) 104   Temp 98.3 F (36.8 C) (Temporal)   Wt (!) 305 lb (138.3 kg)   SpO2 97%   BMI 42.54 kg/m   Wt Readings from Last 3 Encounters:  08/27/19 (!) 305 lb (138.3 kg)  08/17/19 (!) 304 lb (137.9 kg)  11/23/13 275 lb (124.7 kg)    General: Appears his stated age, obese, in NAD. Cardiovascular: Normal rate and rhythm. S1,S2 noted.  No murmur, rubs or  gallops noted. No JVD or BLE edema.  Pulmonary/Chest: Normal effort and positive vesicular breath sounds. No respiratory distress. No wheezes, rales or ronchi noted.  Neurological: Alert and oriented.     BMET    Component Value Date/Time   NA 138 08/17/2019 1205   K 4.8 08/17/2019 1205   CL 101 08/17/2019 1205   CO2 29 08/17/2019 1205   GLUCOSE 113 (H) 08/17/2019 1205   BUN 13 08/17/2019 1205   CREATININE 0.92 08/17/2019 1205   CALCIUM 9.8 08/17/2019 1205    Lipid Panel     Component Value Date/Time   CHOL 244 (H) 08/17/2019 1205   TRIG 301.0 (H) 08/17/2019 1205   HDL 45.70 08/17/2019 1205   CHOLHDL 5 08/17/2019 1205   VLDL 60.2 (H) 08/17/2019 1205    CBC    Component Value Date/Time   WBC 11.7 (H) 08/17/2019 1205   RBC 4.87 08/17/2019 1205   HGB 15.2 08/17/2019 1205   HCT 45.6 08/17/2019 1205   PLT 343.0 08/17/2019 1205   MCV 93.7 08/17/2019 1205   MCHC 33.5 08/17/2019 1205   RDW 13.3 08/17/2019 1205    Hgb A1C Lab Results  Component Value Date   HGBA1C 6.1 08/17/2019           Assessment & Plan:    Nicki Reaper, NP This visit occurred during the SARS-CoV-2 public health emergency.  Safety protocols were in place, including screening questions prior to the visit, additional usage of staff PPE, and extensive cleaning of exam room while observing appropriate contact time as indicated for disinfecting solutions.

## 2019-09-07 NOTE — Assessment & Plan Note (Signed)
Increase Losartan HCT to 100-25, RX sent to pharmacy  Make nurse visit for BP check in 1 week, BMET

## 2019-09-14 ENCOUNTER — Other Ambulatory Visit: Payer: BC Managed Care – PPO

## 2019-09-14 ENCOUNTER — Other Ambulatory Visit: Payer: Self-pay

## 2019-09-19 ENCOUNTER — Other Ambulatory Visit: Payer: Self-pay | Admitting: Internal Medicine

## 2019-09-19 DIAGNOSIS — I10 Essential (primary) hypertension: Secondary | ICD-10-CM

## 2019-09-28 ENCOUNTER — Ambulatory Visit (INDEPENDENT_AMBULATORY_CARE_PROVIDER_SITE_OTHER)
Admission: RE | Admit: 2019-09-28 | Discharge: 2019-09-28 | Disposition: A | Discharge status: Home / Self Care | Payer: BC Managed Care – PPO | Source: Ambulatory Visit | Attending: Internal Medicine | Admitting: Internal Medicine

## 2019-09-28 ENCOUNTER — Ambulatory Visit: Payer: BC Managed Care – PPO | Admitting: Internal Medicine

## 2019-09-28 ENCOUNTER — Encounter: Payer: Self-pay | Admitting: Internal Medicine

## 2019-09-28 ENCOUNTER — Telehealth: Payer: Self-pay | Admitting: Internal Medicine

## 2019-09-28 ENCOUNTER — Other Ambulatory Visit: Payer: Self-pay

## 2019-09-28 ENCOUNTER — Telehealth: Payer: Self-pay

## 2019-09-28 VITALS — BP 154/98 | HR 85 | Temp 98.5°F | Wt 301.0 lb

## 2019-09-28 DIAGNOSIS — M25472 Effusion, left ankle: Secondary | ICD-10-CM

## 2019-09-28 DIAGNOSIS — M79672 Pain in left foot: Secondary | ICD-10-CM

## 2019-09-28 DIAGNOSIS — S99922A Unspecified injury of left foot, initial encounter: Secondary | ICD-10-CM | POA: Diagnosis not present

## 2019-09-28 NOTE — Telephone Encounter (Signed)
Ok for work note to return Wednesday

## 2019-09-28 NOTE — Telephone Encounter (Signed)
[facility] Night - Client TELEPHONE ADVICE RECORD [facility] Patient Name: Ivan Mcmahon Gender: Male DOB: [birth year] Age: 44 Y 87 D Return Phone Number: [phone] (Primary), [phone] (Secondary) Address[place] [address] Client [name] Night - Client Client Site [facility] Physician Webb Silversmith - NP Contact Type Call Who Is Calling Patient / Member / Family / Caregiver Call Type Triage / Clinical Caller Name Charleston Ropes Relationship To Patient Spouse Return Phone Number [phone] (Primary) Chief Complaint Foot or Ankle Injury Reason for Call Request to Schedule Office Appointment Initial Comment Caller states that she wants to get her husband an appointment either today or tomorrow. Caller states that he dropped a concrete block off the back of the tailgate on his foot. He is having swelling in his food. He is able to move it but it is very painful. [place] Not Listed Number for virtual visit given to caller Translation No Nurse Assessment Nurse: Chesley Noon, RN, Lattie Haw Date/Time Eilene Ghazi Time): 09/26/2019 12:03:13 PM Confirm and document reason for call. If symptomatic, describe symptoms. ---Caller states he dropped a concrete block off the tailgate onto his foot 3 days ago, toe is very swollen. It does not have a lot of bruising but is very painful. Has the patient had close contact with a person known or suspected to have the novel coronavirus illness OR traveled / lives in area with major community spread (including international travel) in the last 14 days from the onset of symptoms? * If Asymptomatic, screen for exposure and travel within the last 14 days. ---No Does the patient have any new or worsening symptoms? ---Yes Will a triage be completed? ---Yes Related visit to physician within the last 2 weeks? ---No Does the PT have any chronic conditions?  (i.e. diabetes, asthma, this includes High risk factors for pregnancy, etc.) ---Yes List chronic conditions. ---gout Is this a behavioral health or substance abuse call? ---No PLEASE NOTE: All timestamps contained within this report are represented as [place] Standard Time. CONFIDENTIALTY NOTICE: This fax transmission is intended only for the addressee. It contains information that is legally privileged, confidential or otherwise protected from use or disclosure. If you are not the intended recipient, you are strictly prohibited from reviewing, disclosing, copying using or disseminating any of this information or taking any action in reliance on or regarding this information. If you have received this fax in error, please notify us immediately by telephone so that we can arrange for its return to [place]. Phone: [phone], Toll-Free: [phone], Fax: [phone] Page: 2 of 2 Call Id: [id number] Guidelines Guideline Title Affirmed Question Affirmed Notes Nurse Date/Time Eilene Ghazi Time) Ankle and Foot Injury [1] Limp when walking AND [2] due to a direct blow or crushing injury Jamas Lav 09/26/2019 12:03:40 PM Disp. Time Eilene Ghazi Time) Disposition Final User 09/26/2019 12:11:52 PM See PCP within 24 Hours Yes Chesley Noon, RN, Leland Johns Disagree/Comply Comply Caller Understands Yes PreDisposition Call Doctor Care Advice Given Per Guideline SEE PCP WITHIN 24 HOURS: * IF OFFICE WILL BE CLOSED AND NO PCP (PRIMARY CARE PROVIDER) SECONDLEVEL TRIAGE: You need to be seen within the next 24 hours. A clinic or an urgent care center is often a good source of care if your doctor's office is closed or you can't get an appointment. TREATMENT OF MILD CONTUSIONS (e.g., direct blow to ankle or foot): Use R.I.C.E. (rest, ice, compression, and elevation) for the first 24 to 48 hours. * Continue  to apply crushed ICE in a plastic bag for 10-20 minutes every hour for the first 4 hours. Then apply ice for 10-20  minutes 4 times a day for the first two days. * Apply COMPRESSION with a snug, elastic bandage for 48 hours. Numbness, tingling, or increased pain means the bandage is too tight. * Keep injured ankle or foot ELEVATED and at rest for 24 hours. * After 24 hours of REST, allow any activity that doesn't cause pain. PAIN MEDICINES: * For pain relief, you can take either acetaminophen, ibuprofen, or naproxen. * They are over-the-counter (OTC) pain drugs. You can buy them at the drugstore. * IBUPROFEN (E.G., MOTRIN, ADVIL): Take 400 mg (two 200 mg pills) by mouth every 6 hours. The most you should take each day is 1,200 mg (six 200 mg pills), unless your doctor has told you to take more. CALL BACK IF: * Severe pain persists longer than 2 hours after pain medicine and ice CARE ADVICE given per Foot and Ankle Injury (Adult) guideline. * You become worse. Comments User: Osborne Casco, RN Date/Time Lamount Cohen Time): 09/26/2019 12:07:44 PM Foot pain 4/10 Referrals GO TO FACILITY OTHER - SPECIFY [facility]

## 2019-09-28 NOTE — Telephone Encounter (Signed)
Pt's wife called requesting a letter to be written for Ivan Mcmahon to be out of work for the next day or two. States he was advised to stay off of his feet and he has to be on his feet all day at work.

## 2019-09-28 NOTE — Telephone Encounter (Signed)
msg released to mychart---msg sent to pt to notify

## 2019-09-28 NOTE — Patient Instructions (Signed)
RICE Therapy for Routine Care of Injuries Many injuries can be cared for with rest, ice, compression, and elevation (RICE therapy). This includes:  Resting the injured part.  Putting ice on the injury.  Putting pressure (compression) on the injury.  Raising the injured part (elevation). Using RICE therapy can help to lessen pain and swelling. Supplies needed:  Ice.  Plastic bag.  Towel.  Elastic bandage.  Pillow or pillows to raise (elevate) your injured body part. How to care for your injury with RICE therapy Rest Limit your normal activities, and try not to use the injured part of your body. You can go back to your normal activities when your doctor says it is okay to do them and you feel okay. Ask your doctor if you should do exercises to help your injury get better. Ice Put ice on the injured area. Do not put ice on your bare skin.  Put ice in a plastic bag.  Place a towel between your skin and the bag.  Leave the ice on for 20 minutes, 2-3 times a day. Use ice on as many days as told by your doctor.  Compression Compression means putting pressure on the injured area. This can be done with an elastic bandage. If an elastic bandage has been put on your injury:  Do not wrap the bandage too tight. Wrap the bandage more loosely if part of your body away from the bandage is blue, swollen, cold, painful, or loses feeling (gets numb).  Take off the bandage and put it on again. Do this every 3-4 hours or as told by your doctor.  See your doctor if the bandage seems to make your problems worse.  Elevation Elevation means keeping the injured area raised. If you can, raise the injured area above your heart or the center of your chest. Contact a doctor if:  You keep having pain and swelling.  Your symptoms get worse. Get help right away if:  You have sudden bad pain at your injury or lower than your injury.  You have redness or more swelling around your injury.  You  have tingling or numbness at your injury or lower than your injury, and it does not go away when you take off the bandage. Summary  Many injuries can be cared for using rest, ice, compression, and elevation (RICE therapy).  You can go back to your normal activities when you feel okay and your doctor says it is okay.  Put ice on the injured area as told by your doctor.  Get help if your symptoms get worse or if you keep having pain and swelling. This information is not intended to replace advice given to you by your health care provider. Make sure you discuss any questions you have with your health care provider. Document Revised: 03/22/2017 Document Reviewed: 03/22/2017 Elsevier Patient Education  2020 [employer]  

## 2019-09-28 NOTE — Telephone Encounter (Signed)
Will discuss at upcoming appt.

## 2019-09-28 NOTE — Telephone Encounter (Signed)
Pt came back to clinic after OV to request work note. Work note written and given to pt.

## 2019-09-28 NOTE — Progress Notes (Deleted)
Subjective:    Patient ID: Ivan Mcmahon, male    DOB: [birth year], 44 y.o.   MRN: [id number]  HPI  Pt presents to the clinic today with c/o left foot.  Review of Systems      Past Medical History:  Diagnosis Date  . Hypertension     Current Outpatient Medications  Medication Sig Dispense Refill  . allopurinol (ZYLOPRIM) 100 MG tablet Take 1 tablet (100 mg total) by mouth daily. 30 tablet 2  . fish oil-omega-3 fatty acids 1000 MG capsule Take 1 g by mouth 2 (two) times daily.      Marland Kitchen losartan-hydrochlorothiazide (HYZAAR) 100-25 MG tablet Take 1 tablet by mouth daily. 90 tablet 0  . varenicline (CHANTIX STARTING MONTH PAK) 0.5 MG X 11 & 1 MG X 42 tablet Take one 0.5 mg tablet by mouth once daily for 3 days, then increase to one 0.5 mg tablet twice daily for 4 days, then increase to one 1 mg tablet twice daily. 53 tablet 0   No current facility-administered medications for this visit.    Allergies  Allergen Reactions  . Sulfa Antibiotics     UNKNOWN REACTION MANY YEARS AGO    Family History  Problem Relation Age of Onset  . Diabetes Father     Social History   Socioeconomic History  . Marital status: Married    Spouse name: Not on file  . Number of children: Not on file  . Years of education: Not on file  . Highest education level: Not on file  Occupational History  . Not on file  Tobacco Use  . Smoking status: Current Every Day Smoker    Packs/day: 0.33    Types: Cigarettes  . Smokeless tobacco: Never Used  Substance and Sexual Activity  . Alcohol use: Yes    Comment: beers every other day  . Drug use: No  . Sexual activity: Not on file  Other Topics Concern  . Not on file  Social History Narrative  . Not on file   Social Determinants of Health   Financial Resource Strain:   . Difficulty of Paying Living Expenses:   Food Insecurity:   . Worried About Programme researcher, broadcasting/film/video in the Last Year:   . Barista in the Last Year:   Transportation Needs:    . Freight forwarder (Medical):   Marland Kitchen Lack of Transportation (Non-Medical):   Physical Activity:   . Days of Exercise per Week:   . Minutes of Exercise per Session:   Stress:   . Feeling of Stress :   Social Connections:   . Frequency of Communication with Friends and Family:   . Frequency of Social Gatherings with Friends and Family:   . Attends Religious Services:   . Active Member of Clubs or Organizations:   . Attends Banker Meetings:   Marland Kitchen Marital Status:   Intimate Partner Violence:   . Fear of Current or Ex-Partner:   . Emotionally Abused:   Marland Kitchen Physically Abused:   . Sexually Abused:      Constitutional: Denies fever, malaise, fatigue, headache or abrupt weight changes.  HEENT: Denies eye pain, eye redness, ear pain, ringing in the ears, wax buildup, runny nose, nasal congestion, bloody nose, or sore throat. Respiratory: Denies difficulty breathing, shortness of breath, cough or sputum production.   Cardiovascular: Denies chest pain, chest tightness, palpitations or swelling in the hands or feet.  Gastrointestinal: Denies abdominal pain, bloating, constipation, diarrhea  or blood in the stool.  GU: Denies urgency, frequency, pain with urination, burning sensation, blood in urine, odor or discharge. Musculoskeletal: Denies decrease in range of motion, difficulty with gait, muscle pain or joint pain and swelling.  Skin: Denies redness, rashes, lesions or ulcercations.  Neurological: Denies dizziness, difficulty with memory, difficulty with speech or problems with balance and coordination.  Psych: Denies anxiety, depression, SI/HI.  No other specific complaints in a complete review of systems (except as listed in HPI above).  Objective:   Physical Exam   There were no vitals taken for this visit. Wt Readings from Last 3 Encounters:  09/07/19 (!) 305 lb (138.3 kg)  08/27/19 (!) 305 lb (138.3 kg)  08/17/19 (!) 304 lb (137.9 kg)    General: Appears their  stated age, well developed, well nourished in NAD. Skin: Warm, dry and intact. No rashes, lesions or ulcerations noted. HEENT: Head: normal shape and size; Eyes: sclera white, no icterus, conjunctiva pink, PERRLA and EOMs intact; Ears: Tm's gray and intact, normal light reflex; Nose: mucosa pink and moist, septum midline; Throat/Mouth: Teeth present, mucosa pink and moist, no exudate, lesions or ulcerations noted.  Neck:  Neck supple, trachea midline. No masses, lumps or thyromegaly present.  Cardiovascular: Normal rate and rhythm. S1,S2 noted.  No murmur, rubs or gallops noted. No JVD or BLE edema. No carotid bruits noted. Pulmonary/Chest: Normal effort and positive vesicular breath sounds. No respiratory distress. No wheezes, rales or ronchi noted.  Abdomen: Soft and nontender. Normal bowel sounds. No distention or masses noted. Liver, spleen and kidneys non palpable. Musculoskeletal: Normal range of motion. No signs of joint swelling. No difficulty with gait.  Neurological: Alert and oriented. Cranial nerves II-XII grossly intact. Coordination normal.  Psychiatric: Mood and affect normal. Behavior is normal. Judgment and thought content normal.   EKG:  BMET    Component Value Date/Time   NA 138 08/17/2019 1205   K 4.8 08/17/2019 1205   CL 101 08/17/2019 1205   CO2 29 08/17/2019 1205   GLUCOSE 113 (H) 08/17/2019 1205   BUN 13 08/17/2019 1205   CREATININE 0.92 08/17/2019 1205   CALCIUM 9.8 08/17/2019 1205    Lipid Panel     Component Value Date/Time   CHOL 244 (H) 08/17/2019 1205   TRIG 301.0 (H) 08/17/2019 1205   HDL 45.70 08/17/2019 1205   CHOLHDL 5 08/17/2019 1205   VLDL 60.2 (H) 08/17/2019 1205    CBC    Component Value Date/Time   WBC 11.7 (H) 08/17/2019 1205   RBC 4.87 08/17/2019 1205   HGB 15.2 08/17/2019 1205   HCT 45.6 08/17/2019 1205   PLT 343.0 08/17/2019 1205   MCV 93.7 08/17/2019 1205   MCHC 33.5 08/17/2019 1205   RDW 13.3 08/17/2019 1205    Hgb A1C Lab  Results  Component Value Date   HGBA1C 6.1 08/17/2019           Assessment & Plan:    Webb Silversmith, NP This visit occurred during the SARS-CoV-2 public health emergency.  Safety protocols were in place, including screening questions prior to the visit, additional usage of staff PPE, and extensive cleaning of exam room while observing appropriate contact time as indicated for disinfecting solutions.

## 2019-09-28 NOTE — Progress Notes (Signed)
Subjective:    Patient ID: Ivan Mcmahon, male    DOB: [birth year], 44 y.o.   MRN: [id number]  HPI  Pt presents to the clinic today with c/o left foot pain. He reports this started 1 week ago he dropped a concrete block on his foot. He reports associated swelling in the left ankle. The pain is worse with ambulation. He has tried Ibuprofen and ice with minimal relief.  Review of Systems  Past Medical History:  Diagnosis Date  . Hypertension     Current Outpatient Medications  Medication Sig Dispense Refill  . allopurinol (ZYLOPRIM) 100 MG tablet Take 1 tablet (100 mg total) by mouth daily. 30 tablet 2  . fish oil-omega-3 fatty acids 1000 MG capsule Take 1 g by mouth 2 (two) times daily.      Marland Kitchen losartan-hydrochlorothiazide (HYZAAR) 100-25 MG tablet Take 1 tablet by mouth daily. 90 tablet 0  . varenicline (CHANTIX STARTING MONTH PAK) 0.5 MG X 11 & 1 MG X 42 tablet Take one 0.5 mg tablet by mouth once daily for 3 days, then increase to one 0.5 mg tablet twice daily for 4 days, then increase to one 1 mg tablet twice daily. 53 tablet 0   No current facility-administered medications for this visit.    Allergies  Allergen Reactions  . Sulfa Antibiotics     UNKNOWN REACTION MANY YEARS AGO    Family History  Problem Relation Age of Onset  . Diabetes Father     Social History   Socioeconomic History  . Marital status: Married    Spouse name: Not on file  . Number of children: Not on file  . Years of education: Not on file  . Highest education level: Not on file  Occupational History  . Not on file  Tobacco Use  . Smoking status: Current Every Day Smoker    Packs/day: 0.33    Types: Cigarettes  . Smokeless tobacco: Never Used  Substance and Sexual Activity  . Alcohol use: Yes    Comment: beers every other day  . Drug use: No  . Sexual activity: Not on file  Other Topics Concern  . Not on file  Social History Narrative  . Not on file   Social Determinants of Health    Financial Resource Strain:   . Difficulty of Paying Living Expenses:   Food Insecurity:   . Worried About Programme researcher, broadcasting/film/video in the Last Year:   . Barista in the Last Year:   Transportation Needs:   . Freight forwarder (Medical):   Marland Kitchen Lack of Transportation (Non-Medical):   Physical Activity:   . Days of Exercise per Week:   . Minutes of Exercise per Session:   Stress:   . Feeling of Stress :   Social Connections:   . Frequency of Communication with Friends and Family:   . Frequency of Social Gatherings with Friends and Family:   . Attends Religious Services:   . Active Member of Clubs or Organizations:   . Attends Banker Meetings:   Marland Kitchen Marital Status:   Intimate Partner Violence:   . Fear of Current or Ex-Partner:   . Emotionally Abused:   Marland Kitchen Physically Abused:   . Sexually Abused:      Constitutional: Denies fever, malaise, fatigue, headache or abrupt weight changes.  Respiratory: Denies difficulty breathing, shortness of breath, cough or sputum production.   Cardiovascular: Denies chest pain, chest tightness, palpitations or swelling in  the hands or feet.  Musculoskeletal: Pt reports left foot/ankle pain, swelling and difficulty with gait. Denies muscle pain.  Skin: Denies redness, rashes, lesions or ulcercations.   No other specific complaints in a complete review of systems (except as listed in HPI above).     Objective:   Physical Exam  BP (!) 154/98   Pulse 85   Temp 98.5 F (36.9 C) (Temporal)   Wt (!) 301 lb (136.5 kg)   SpO2 98%   BMI 41.98 kg/m   Wt Readings from Last 3 Encounters:  09/07/19 (!) 305 lb (138.3 kg)  08/27/19 (!) 305 lb (138.3 kg)  08/17/19 (!) 304 lb (137.9 kg)    General: Appears his stated age, obese, in NAD. Skin: Warm, dry and intact. No bruising noted. Cardiovascular: Normal rate and rhythm. Pedal pulse 2+ on the left. Pulmonary/Chest: Normal effort and positive vesicular breath sounds.   Musculoskeletal: Normal flexion, extension and rotation of the left ankle. 1+ swelling around the left ankle. Pain with palpation over the 5th metatarsal near the lateral malleolus. Able to stand on heels, unable to stand on tiptoes. Limps with normal gait. Neurological: Alert and oriented.   BMET    Component Value Date/Time   NA 138 08/17/2019 1205   K 4.8 08/17/2019 1205   CL 101 08/17/2019 1205   CO2 29 08/17/2019 1205   GLUCOSE 113 (H) 08/17/2019 1205   BUN 13 08/17/2019 1205   CREATININE 0.92 08/17/2019 1205   CALCIUM 9.8 08/17/2019 1205    Lipid Panel     Component Value Date/Time   CHOL 244 (H) 08/17/2019 1205   TRIG 301.0 (H) 08/17/2019 1205   HDL 45.70 08/17/2019 1205   CHOLHDL 5 08/17/2019 1205   VLDL 60.2 (H) 08/17/2019 1205    CBC    Component Value Date/Time   WBC 11.7 (H) 08/17/2019 1205   RBC 4.87 08/17/2019 1205   HGB 15.2 08/17/2019 1205   HCT 45.6 08/17/2019 1205   PLT 343.0 08/17/2019 1205   MCV 93.7 08/17/2019 1205   MCHC 33.5 08/17/2019 1205   RDW 13.3 08/17/2019 1205    Hgb A1C Lab Results  Component Value Date   HGBA1C 6.1 08/17/2019            Assessment & Plan:   Left Foot Pain, Left Ankle Swelling:  Xray left foot today ACE wrap for compression Encouraged elevation and ice Continue Ibuprofen for pain and inflammation  Will follow up after xray is back, return precautions discussed Webb Silversmith, NP This visit occurred during the SARS-CoV-2 public health emergency.  Safety protocols were in place, including screening questions prior to the visit, additional usage of staff PPE, and extensive cleaning of exam room while observing appropriate contact time as indicated for disinfecting solutions.

## 2019-09-28 NOTE — Telephone Encounter (Signed)
Pt has apt today with PCP

## 2019-09-29 NOTE — Telephone Encounter (Signed)
Last read by Rocky Morel at 5:36 PM on 09/28/2019.

## 2019-10-18 ENCOUNTER — Other Ambulatory Visit: Payer: Self-pay | Admitting: Internal Medicine

## 2019-10-19 ENCOUNTER — Encounter: Payer: Self-pay | Admitting: Internal Medicine

## 2019-10-19 NOTE — Telephone Encounter (Signed)
Did he have any success with the first round? Did he take it in Feb but not in march?

## 2019-10-19 NOTE — Telephone Encounter (Signed)
Last filled 08/17/2019... please advise

## 2019-10-21 MED ORDER — VARENICLINE TARTRATE 1 MG PO TABS: 1.0000 mg | ORAL_TABLET | Freq: Two times a day (BID) | ORAL | 0 refills | Status: DC

## 2019-10-21 NOTE — Addendum Note (Signed)
Addended by: Lorre Munroe on: 10/21/2019 10:32 PM   Modules accepted: Orders

## 2019-10-21 NOTE — Telephone Encounter (Signed)
The refill shouldn't have been denied. I just wanted to know. If he was taking or had a break in therapy. If he is still taking I will send in the continuing month pack instead of refilling the starter pack

## 2019-11-05 ENCOUNTER — Ambulatory Visit: Payer: BC Managed Care – PPO | Admitting: Internal Medicine

## 2019-11-13 ENCOUNTER — Ambulatory Visit: Payer: BC Managed Care – PPO | Admitting: Internal Medicine

## 2019-11-13 DIAGNOSIS — Z0289 Encounter for other administrative examinations: Secondary | ICD-10-CM

## 2019-11-13 NOTE — Progress Notes (Deleted)
Subjective:    Patient ID: Ivan Mcmahon, male    DOB: 16-Jun-1976, 44 y.o.   MRN: [id number]  HPI  Pt presents to the clinic today for follow up of HTN. He reports his blood pressure has been more elevated, and he has noticed swelling in his ankles. He is taking Losartan HCT. He is having some muscle cramping. His BP today is. There is no ECG on file.  Review of Systems      Past Medical History:  Diagnosis Date  . Hypertension     Current Outpatient Medications  Medication Sig Dispense Refill  . allopurinol (ZYLOPRIM) 100 MG tablet Take 1 tablet (100 mg total) by mouth daily. 30 tablet 2  . fish oil-omega-3 fatty acids 1000 MG capsule Take 1 g by mouth 2 (two) times daily.      Marland Kitchen losartan-hydrochlorothiazide (HYZAAR) 100-25 MG tablet Take 1 tablet by mouth daily. 90 tablet 0  . varenicline (CHANTIX CONTINUING MONTH PAK) 1 MG tablet Take 1 tablet (1 mg total) by mouth 2 (two) times daily. 60 tablet 0   No current facility-administered medications for this visit.    Allergies  Allergen Reactions  . Sulfa Antibiotics     UNKNOWN REACTION MANY YEARS AGO    Family History  Problem Relation Age of Onset  . Diabetes Father     Social History   Socioeconomic History  . Marital status: Married    Spouse name: Not on file  . Number of children: Not on file  . Years of education: Not on file  . Highest education level: Not on file  Occupational History  . Not on file  Tobacco Use  . Smoking status: Current Every Day Smoker    Packs/day: 0.33    Types: Cigarettes  . Smokeless tobacco: Never Used  Substance and Sexual Activity  . Alcohol use: Yes    Comment: beers every other day  . Drug use: No  . Sexual activity: Not on file  Other Topics Concern  . Not on file  Social History Narrative  . Not on file   Social Determinants of Health   Financial Resource Strain:   . Difficulty of Paying Living Expenses:   Food Insecurity:   . Worried About Charity fundraiser  in the Last Year:   . Arboriculturist in the Last Year:   Transportation Needs:   . Film/video editor (Medical):   Marland Kitchen Lack of Transportation (Non-Medical):   Physical Activity:   . Days of Exercise per Week:   . Minutes of Exercise per Session:   Stress:   . Feeling of Stress :   Social Connections:   . Frequency of Communication with Friends and Family:   . Frequency of Social Gatherings with Friends and Family:   . Attends Religious Services:   . Active Member of Clubs or Organizations:   . Attends Archivist Meetings:   Marland Kitchen Marital Status:   Intimate Partner Violence:   . Fear of Current or Ex-Partner:   . Emotionally Abused:   Marland Kitchen Physically Abused:   . Sexually Abused:      Constitutional: Denies fever, malaise, fatigue, headache or abrupt weight changes.  HEENT: Denies eye pain, eye redness, ear pain, ringing in the ears, wax buildup, runny nose, nasal congestion, bloody nose, or sore throat. Respiratory: Denies difficulty breathing, shortness of breath, cough or sputum production.   Cardiovascular: Pt reports swelling in legs. Denies chest pain, chest tightness,  palpitations or swelling in the hands.  Gastrointestinal: Denies abdominal pain, bloating, constipation, diarrhea or blood in the stool.  GU: Denies urgency, frequency, pain with urination, burning sensation, blood in urine, odor or discharge. Musculoskeletal: Pt reports muscle cramps. Denies decrease in range of motion, difficulty with gait, or joint pain and swelling.  Skin: Denies redness, rashes, lesions or ulcercations.  Neurological: Denies dizziness, difficulty with memory, difficulty with speech or problems with balance and coordination.  Psych: Denies anxiety, depression, SI/HI.  No other specific complaints in a complete review of systems (except as listed in HPI above).  Objective:   Physical Exam    There were no vitals taken for this visit. Wt Readings from Last 3 Encounters:    09/28/19 (!) 301 lb (136.5 kg)  09/07/19 (!) 305 lb (138.3 kg)  08/27/19 (!) 305 lb (138.3 kg)    General: Appears their stated age, well developed, well nourished in NAD. Skin: Warm, dry and intact. No rashes, lesions or ulcerations noted. HEENT: Head: normal shape and size; Eyes: sclera white, no icterus, conjunctiva pink, PERRLA and EOMs intact; Ears: Tm's gray and intact, normal light reflex; Nose: mucosa pink and moist, septum midline; Throat/Mouth: Teeth present, mucosa pink and moist, no exudate, lesions or ulcerations noted.  Neck:  Neck supple, trachea midline. No masses, lumps or thyromegaly present.  Cardiovascular: Normal rate and rhythm. S1,S2 noted.  No murmur, rubs or gallops noted. No JVD or BLE edema. No carotid bruits noted. Pulmonary/Chest: Normal effort and positive vesicular breath sounds. No respiratory distress. No wheezes, rales or ronchi noted.  Abdomen: Soft and nontender. Normal bowel sounds. No distention or masses noted. Liver, spleen and kidneys non palpable. Musculoskeletal: Normal range of motion. No signs of joint swelling. No difficulty with gait.  Neurological: Alert and oriented. Cranial nerves II-XII grossly intact. Coordination normal.  Psychiatric: Mood and affect normal. Behavior is normal. Judgment and thought content normal.   EKG:  BMET    Component Value Date/Time   NA 138 08/17/2019 1205   K 4.8 08/17/2019 1205   CL 101 08/17/2019 1205   CO2 29 08/17/2019 1205   GLUCOSE 113 (H) 08/17/2019 1205   BUN 13 08/17/2019 1205   CREATININE 0.92 08/17/2019 1205   CALCIUM 9.8 08/17/2019 1205    Lipid Panel     Component Value Date/Time   CHOL 244 (H) 08/17/2019 1205   TRIG 301.0 (H) 08/17/2019 1205   HDL 45.70 08/17/2019 1205   CHOLHDL 5 08/17/2019 1205   VLDL 60.2 (H) 08/17/2019 1205    CBC    Component Value Date/Time   WBC 11.7 (H) 08/17/2019 1205   RBC 4.87 08/17/2019 1205   HGB 15.2 08/17/2019 1205   HCT 45.6 08/17/2019 1205    PLT 343.0 08/17/2019 1205   MCV 93.7 08/17/2019 1205   MCHC 33.5 08/17/2019 1205   RDW 13.3 08/17/2019 1205    Hgb A1C Lab Results  Component Value Date   HGBA1C 6.1 08/17/2019          Assessment & Plan:    Nicki Reaper, NP This visit occurred during the SARS-CoV-2 public health emergency.  Safety protocols were in place, including screening questions prior to the visit, additional usage of staff PPE, and extensive cleaning of exam room while observing appropriate contact time as indicated for disinfecting solutions.

## 2019-11-22 ENCOUNTER — Other Ambulatory Visit: Payer: Self-pay | Admitting: Internal Medicine

## 2019-11-24 NOTE — Telephone Encounter (Signed)
Does patient need a follow up appointment on this medication? Nothing is scheduled for future appointments. Allopurinol was re started on 08/17/19

## 2019-12-09 ENCOUNTER — Other Ambulatory Visit: Payer: Self-pay | Admitting: Internal Medicine

## 2019-12-17 ENCOUNTER — Encounter: Payer: Self-pay | Admitting: Internal Medicine

## 2019-12-17 MED ORDER — LOSARTAN POTASSIUM-HCTZ 100-25 MG PO TABS: 1.0000 | ORAL_TABLET | Freq: Every day | ORAL | 0 refills | Status: DC

## 2019-12-24 ENCOUNTER — Encounter: Payer: Self-pay | Admitting: Internal Medicine

## 2019-12-25 ENCOUNTER — Other Ambulatory Visit: Payer: Self-pay

## 2019-12-25 ENCOUNTER — Ambulatory Visit (INDEPENDENT_AMBULATORY_CARE_PROVIDER_SITE_OTHER): Payer: BC Managed Care – PPO | Admitting: Internal Medicine

## 2019-12-25 VITALS — BP 146/94 | HR 96 | Temp 97.8°F | Wt 291.0 lb

## 2019-12-25 DIAGNOSIS — S50812A Abrasion of left forearm, initial encounter: Secondary | ICD-10-CM | POA: Diagnosis not present

## 2019-12-25 DIAGNOSIS — W5503XA Scratched by cat, initial encounter: Secondary | ICD-10-CM

## 2019-12-25 DIAGNOSIS — W5501XA Bitten by cat, initial encounter: Secondary | ICD-10-CM | POA: Diagnosis not present

## 2019-12-25 DIAGNOSIS — S61459A Open bite of unspecified hand, initial encounter: Secondary | ICD-10-CM | POA: Diagnosis not present

## 2019-12-25 DIAGNOSIS — Z23 Encounter for immunization: Secondary | ICD-10-CM

## 2019-12-25 MED ORDER — AMOXICILLIN-POT CLAVULANATE 875-125 MG PO TABS: 1.0000 | ORAL_TABLET | Freq: Two times a day (BID) | ORAL | 0 refills | Status: DC

## 2019-12-25 NOTE — Progress Notes (Signed)
Subjective:    Patient ID: Ivan Mcmahon, male    DOB: [birth year], 44 y.o.   MRN: [id number]  HPI  Patient presents to the clinic today with complaint of a cat bite to his bilateral hands and left forearm. This occurred yesterday. He reports he opened a door and a stray cat came in an attacked him. He is unsure of vaccination status, reports this cat is being held by animal control, rabies testing pending. He reports multiple puncture wounds to hands and scratches to the left forearm. Today, he reports increased tenderness, swelling and redness. He denies fever, chills or nausea. He has tried some natural topical medication OTC. He is unsure of his last tetanus injection.  Review of Systems      Past Medical History:  Diagnosis Date  . Hypertension     Current Outpatient Medications  Medication Sig Dispense Refill  . allopurinol (ZYLOPRIM) 100 MG tablet TAKE 1 TABLET BY MOUTH EVERY DAY 90 tablet 1  . fish oil-omega-3 fatty acids 1000 MG capsule Take 1 g by mouth 2 (two) times daily.      Marland Kitchen losartan-hydrochlorothiazide (HYZAAR) 100-25 MG tablet Take 1 tablet by mouth daily. MUST SCHEDULE PHYSICAL 30 tablet 0  . varenicline (CHANTIX CONTINUING MONTH PAK) 1 MG tablet Take 1 tablet (1 mg total) by mouth 2 (two) times daily. 60 tablet 0   No current facility-administered medications for this visit.    Allergies  Allergen Reactions  . Sulfa Antibiotics     UNKNOWN REACTION MANY YEARS AGO    Family History  Problem Relation Age of Onset  . Diabetes Father     Social History   Socioeconomic History  . Marital status: Married    Spouse name: Not on file  . Number of children: Not on file  . Years of education: Not on file  . Highest education level: Not on file  Occupational History  . Not on file  Tobacco Use  . Smoking status: Current Every Day Smoker    Packs/day: 0.33    Types: Cigarettes  . Smokeless tobacco: Never Used  Substance and Sexual Activity  . Alcohol use:  Yes    Comment: beers every other day  . Drug use: No  . Sexual activity: Not on file  Other Topics Concern  . Not on file  Social History Narrative  . Not on file   Social Determinants of Health   Financial Resource Strain:   . Difficulty of Paying Living Expenses:   Food Insecurity:   . Worried About Charity fundraiser in the Last Year:   . Arboriculturist in the Last Year:   Transportation Needs:   . Film/video editor (Medical):   Marland Kitchen Lack of Transportation (Non-Medical):   Physical Activity:   . Days of Exercise per Week:   . Minutes of Exercise per Session:   Stress:   . Feeling of Stress :   Social Connections:   . Frequency of Communication with Friends and Family:   . Frequency of Social Gatherings with Friends and Family:   . Attends Religious Services:   . Active Member of Clubs or Organizations:   . Attends Archivist Meetings:   Marland Kitchen Marital Status:   Intimate Partner Violence:   . Fear of Current or Ex-Partner:   . Emotionally Abused:   Marland Kitchen Physically Abused:   . Sexually Abused:      Constitutional: Denies fever, malaise, fatigue, headache or abrupt  weight changes.  Respiratory: Denies difficulty breathing, shortness of breath, cough or sputum production.   Cardiovascular: Denies chest pain, chest tightness, palpitations or swelling in the hands or feet.  Musculoskeletal: Denies decrease in range of motion, difficulty with gait, muscle pain or joint pain and swelling.  Skin: Patient reports cat bite to bilateral hands.   Neurological: Denies numbness, tingling, weakness or decrease coordination.    No other specific complaints in a complete review of systems (except as listed in HPI above).  Objective:   Physical Exam   BP (!) 146/94   Pulse 96   Temp 97.8 F (36.6 C) (Temporal)   Wt 291 lb (132 kg)   SpO2 98%   BMI 40.59 kg/m   Wt Readings from Last 3 Encounters:  09/28/19 (!) 301 lb (136.5 kg)  09/07/19 (!) 305 lb (138.3 kg)    08/27/19 (!) 305 lb (138.3 kg)    General: Appears his stated age, obese in NAD. Skin: Puncture wounds noted of right thumb with redness extending up to the finger, no streaking noted. 2 linear scratches noted on the left forearm. Multiple scattered bites noted of other fingers, bilateral hands. Cardiovascular: Radial pulses 2+ bilaterally. Pulmonary/Chest: Normal effort. Musculoskeletal: Normal flexion and extension of the fingers. Neurological: Alert and oriented.   BMET    Component Value Date/Time   NA 138 08/17/2019 1205   K 4.8 08/17/2019 1205   CL 101 08/17/2019 1205   CO2 29 08/17/2019 1205   GLUCOSE 113 (H) 08/17/2019 1205   BUN 13 08/17/2019 1205   CREATININE 0.92 08/17/2019 1205   CALCIUM 9.8 08/17/2019 1205    Lipid Panel     Component Value Date/Time   CHOL 244 (H) 08/17/2019 1205   TRIG 301.0 (H) 08/17/2019 1205   HDL 45.70 08/17/2019 1205   CHOLHDL 5 08/17/2019 1205   VLDL 60.2 (H) 08/17/2019 1205    CBC    Component Value Date/Time   WBC 11.7 (H) 08/17/2019 1205   RBC 4.87 08/17/2019 1205   HGB 15.2 08/17/2019 1205   HCT 45.6 08/17/2019 1205   PLT 343.0 08/17/2019 1205   MCV 93.7 08/17/2019 1205   MCHC 33.5 08/17/2019 1205   RDW 13.3 08/17/2019 1205    Hgb A1C Lab Results  Component Value Date   HGBA1C 6.1 08/17/2019           Assessment & Plan:   Cat Bite of Bilateral Hands, Cat Scratch Left Forearm:  Tdap today RX for Augmetin 875-125 mg 1 tab PO BID x 10 days  Return precautions discussed  Nicki Reaper, NP This visit occurred during the SARS-CoV-2 public health emergency.  Safety protocols were in place, including screening questions prior to the visit, additional usage of staff PPE, and extensive cleaning of exam room while observing appropriate contact time as indicated for disinfecting solutions.

## 2019-12-25 NOTE — Patient Instructions (Signed)
Animal Bite, Adult Animal bite wounds can be mild or serious. It is important to get medical treatment to prevent infection. Ask your doctor if you need treatment to prevent an infection that can spread from animals to humans (rabies). Follow these instructions at home: Wound care   Follow instructions from your doctor about how to take care of your wound. Make sure you: ? Wash your hands with soap and water before you change your bandage (dressing). If you cannot use soap and water, use hand sanitizer. ? Change your bandage as told by your doctor. ? Leave stitches (sutures), skin glue, or skin tape (adhesive) strips in place. They may need to stay in place for 2 weeks or longer. If tape strips get loose and curl up, you may trim the loose edges. Do not remove tape strips completely unless your doctor says it is okay.  Check your wound every day for signs of infection. Check for: ? More redness, swelling, or pain. ? More fluid or blood. ? Warmth. ? Pus or a bad smell. Medicines  Take or apply over-the-counter and prescription medicines only as told by your doctor.  If you were prescribed an antibiotic, take or apply it as told by your doctor. Do not stop using the antibiotic even if your wound gets better. General instructions   Keep the injured area raised (elevated) above the level of your heart while you are sitting or lying down.  If directed, put ice on the injured area. ? Put ice in a plastic bag. ? Place a towel between your skin and the bag. ? Leave the ice on for 20 minutes, 2-3 times per day.  Keep all follow-up visits as told by your doctor. This is important. Contact a doctor if:  You have more redness, swelling, or pain around your wound.  Your wound feels warm to the touch.  You have a fever or chills.  You have a general feeling of sickness (malaise).  You feel sick to your stomach (nauseous).  You throw up (vomit).  You have pain that does not get  better. Get help right away if:  You have a red streak going away from your wound.  You have any of these coming from your wound: ? Non-clear fluid. ? More blood. ? Pus or a bad smell.  You have trouble moving your injured area.  You lose feeling (have numbness) or feel tingling anywhere on your body. Summary  It is important to get the right medical treatment for animal bites. Treatment can help you to not get an infection. Ask your doctor if you need treatment to prevent an infection that can spread from animals to humans (rabies).  Check your wound every day for signs of infection, such as more redness or swelling instead of less.  If you have a red streak going away from your wound, get medical help right away. This information is not intended to replace advice given to you by your health care provider. Make sure you discuss any questions you have with your health care provider. Document Revised: 06/27/2017 Document Reviewed: 01/10/2017 Elsevier Patient Education  2020 Elsevier Inc.  

## 2019-12-27 ENCOUNTER — Encounter: Payer: Self-pay | Admitting: Internal Medicine

## 2020-01-14 ENCOUNTER — Encounter: Payer: Self-pay | Admitting: Internal Medicine

## 2020-01-14 ENCOUNTER — Other Ambulatory Visit: Payer: Self-pay | Admitting: Internal Medicine

## 2020-01-14 MED ORDER — LOSARTAN POTASSIUM-HCTZ 100-25 MG PO TABS: 1.0000 | ORAL_TABLET | Freq: Every day | ORAL | 0 refills | Status: DC

## 2020-01-19 ENCOUNTER — Ambulatory Visit (INDEPENDENT_AMBULATORY_CARE_PROVIDER_SITE_OTHER): Payer: BC Managed Care – PPO | Admitting: Internal Medicine

## 2020-01-19 ENCOUNTER — Other Ambulatory Visit: Payer: Self-pay

## 2020-01-19 ENCOUNTER — Encounter: Payer: Self-pay | Admitting: Internal Medicine

## 2020-01-19 VITALS — BP 150/98 | HR 104 | Temp 97.7°F | Ht 70.0 in | Wt 290.0 lb

## 2020-01-19 DIAGNOSIS — K921 Melena: Secondary | ICD-10-CM | POA: Diagnosis not present

## 2020-01-19 DIAGNOSIS — K644 Residual hemorrhoidal skin tags: Secondary | ICD-10-CM | POA: Diagnosis not present

## 2020-01-19 DIAGNOSIS — R1031 Right lower quadrant pain: Secondary | ICD-10-CM

## 2020-01-19 DIAGNOSIS — Z0000 Encounter for general adult medical examination without abnormal findings: Secondary | ICD-10-CM | POA: Diagnosis not present

## 2020-01-19 DIAGNOSIS — Z0001 Encounter for general adult medical examination with abnormal findings: Secondary | ICD-10-CM

## 2020-01-19 MED ORDER — HYDROCORTISONE ACETATE 25 MG RE SUPP
25.0000 mg | Freq: Two times a day (BID) | RECTAL | 0 refills | Status: DC
Start: 2020-01-19 — End: 2020-06-23

## 2020-01-19 NOTE — Patient Instructions (Signed)

## 2020-01-19 NOTE — Progress Notes (Signed)
Subjective:    Patient ID: Ivan Mcmahon, male    DOB: March 09, 1976, 44 y.o.   MRN: [id number]  HPI  Pt presents to the clinic today for his annual exam.  He has had some RLQ abdominal pain and blood in his stool. He reports this started after taking Augmentin for his cat bite. He describes the RLQ abdominal pain as sore but sharp at times. The pain does not radiate. He has had some decreased appetite but denies nausea, vomiting, constipation. He has had intermittent rectal itching and blood in his stool for years but it has never been this bad. He reports history of hemorrhoids, using Witch Hazel OTC. He denies fever, chills or body aches.  Flu: never Tetanus: 12/2019 Vision Screening: as needed Dentist: as needed  Diet: He does meat. He consumes fruits and veggies daily. He does occasionally eat fried foods. He drinks mostly water. Exercise: None  Review of Systems  Past Medical History:  Diagnosis Date  . Hypertension     Current Outpatient Medications  Medication Sig Dispense Refill  . allopurinol (ZYLOPRIM) 100 MG tablet TAKE 1 TABLET BY MOUTH EVERY DAY 90 tablet 1  . amoxicillin-clavulanate (AUGMENTIN) 875-125 MG tablet Take 1 tablet by mouth 2 (two) times daily. 20 tablet 0  . fish oil-omega-3 fatty acids 1000 MG capsule Take 1 g by mouth 2 (two) times daily.      Marland Kitchen losartan-hydrochlorothiazide (HYZAAR) 100-25 MG tablet Take 1 tablet by mouth daily. 30 tablet 0  . varenicline (CHANTIX CONTINUING MONTH PAK) 1 MG tablet Take 1 tablet (1 mg total) by mouth 2 (two) times daily. 60 tablet 0   No current facility-administered medications for this visit.    Allergies  Allergen Reactions  . Sulfa Antibiotics     UNKNOWN REACTION MANY YEARS AGO    Family History  Problem Relation Age of Onset  . Diabetes Father     Social History   Socioeconomic History  . Marital status: Married    Spouse name: Not on file  . Number of children: Not on file  . Years of education: Not  on file  . Highest education level: Not on file  Occupational History  . Not on file  Tobacco Use  . Smoking status: Current Every Day Smoker    Packs/day: 0.33    Types: Cigarettes  . Smokeless tobacco: Never Used  Substance and Sexual Activity  . Alcohol use: Yes    Comment: beers every other day  . Drug use: No  . Sexual activity: Not on file  Other Topics Concern  . Not on file  Social History Narrative  . Not on file   Social Determinants of Health   Financial Resource Strain:   . Difficulty of Paying Living Expenses:   Food Insecurity:   . Worried About Programme researcher, broadcasting/film/video in the Last Year:   . Barista in the Last Year:   Transportation Needs:   . Freight forwarder (Medical):   Marland Kitchen Lack of Transportation (Non-Medical):   Physical Activity:   . Days of Exercise per Week:   . Minutes of Exercise per Session:   Stress:   . Feeling of Stress :   Social Connections:   . Frequency of Communication with Friends and Family:   . Frequency of Social Gatherings with Friends and Family:   . Attends Religious Services:   . Active Member of Clubs or Organizations:   . Attends Banker  Meetings:   Marland Kitchen Marital Status:   Intimate Partner Violence:   . Fear of Current or Ex-Partner:   . Emotionally Abused:   Marland Kitchen Physically Abused:   . Sexually Abused:      Constitutional: Denies fever, malaise, fatigue, headache or abrupt weight changes.  HEENT: Pt reports decreased visual acuity. Denies eye pain, eye redness, ear pain, ringing in the ears, wax buildup, runny nose, nasal congestion, bloody nose, or sore throat. Respiratory: Denies difficulty breathing, shortness of breath, cough or sputum production.   Cardiovascular: Denies chest pain, chest tightness, palpitations or swelling in the hands or feet.  Gastrointestinal: Pt reports intermittent reflux, RLQ pain and blood in his stool. Denies  bloating, constipation.  GU: Denies urgency, frequency, pain with  urination, burning sensation, blood in urine, odor or discharge. Musculoskeletal: Pt reports intermittent knee pain. Denies decrease in range of motion, difficulty with gait, muscle pain or joint swelling.  Skin: Denies redness, rashes, lesions or ulcercations.  Neurological: Pt reports insomnia. Denies dizziness, difficulty with memory, difficulty with speech or problems with balance and coordination.  Psych: Denies anxiety, depression, SI/HI.  No other specific complaints in a complete review of systems (except as listed in HPI above).     Objective:   Physical Exam  BP (!) 150/98   Pulse (!) 104   Temp 97.7 F (36.5 C) (Temporal)   Ht 5\' 10"  (1.778 m)   Wt 290 lb (131.5 kg)   SpO2 97%   BMI 41.61 kg/m   Wt Readings from Last 3 Encounters:  12/25/19 291 lb (132 kg)  09/28/19 (!) 301 lb (136.5 kg)  09/07/19 (!) 305 lb (138.3 kg)    General: Appears his stated age, obese, in NAD. Skin: Warm, dry and intact. Cat scratch to left forearm scabbed, healing. HEENT: Head: normal shape and size; Eyes: sclera white, no icterus, conjunctiva pink, PERRLA and EOMs intact;  Neck:  Neck supple, trachea midline. No masses, lumps or thyromegaly present.  Cardiovascular: Normal rate and rhythm. S1,S2 noted.  No murmur, rubs or gallops noted. No JVD or BLE edema. No carotid bruits noted. Pulmonary/Chest: Normal effort and positive vesicular breath sounds. No respiratory distress. No wheezes, rales or ronchi noted.  Abdomen: Soft and tender in the RLQ at McBurney's point. + rebound tenderness. Negative psoas. Normal bowel sounds. No distention or masses noted. Liver, spleen and kidneys non palpable. Rectal: External hemorrhoid noted at 5 oclock. Bleeding prolapsed hemorrhoid noted at 9 oclock. Musculoskeletal: Strength 5/5 BUE/BLE. No difficulty with gait.  Neurological: Alert and oriented. Cranial nerves II-XII grossly intact. Coordination normal.  Psychiatric: Mood and affect normal. Behavior is  normal. Judgment and thought content normal.   BMET    Component Value Date/Time   NA 138 08/17/2019 1205   K 4.8 08/17/2019 1205   CL 101 08/17/2019 1205   CO2 29 08/17/2019 1205   GLUCOSE 113 (H) 08/17/2019 1205   BUN 13 08/17/2019 1205   CREATININE 0.92 08/17/2019 1205   CALCIUM 9.8 08/17/2019 1205    Lipid Panel     Component Value Date/Time   CHOL 244 (H) 08/17/2019 1205   TRIG 301.0 (H) 08/17/2019 1205   HDL 45.70 08/17/2019 1205   CHOLHDL 5 08/17/2019 1205   VLDL 60.2 (H) 08/17/2019 1205    CBC    Component Value Date/Time   WBC 11.7 (H) 08/17/2019 1205   RBC 4.87 08/17/2019 1205   HGB 15.2 08/17/2019 1205   HCT 45.6 08/17/2019 1205   PLT  343.0 08/17/2019 1205   MCV 93.7 08/17/2019 1205   MCHC 33.5 08/17/2019 1205   RDW 13.3 08/17/2019 1205    Hgb A1C Lab Results  Component Value Date   HGBA1C 6.1 08/17/2019            Assessment & Plan:   Preventative Health Maintneance:  Encouraged him to get a flu shot in the fall Tetanus UTD He declines Covid vaccine Encouraged him to consume a balanced diet and exercise regimen Advised him to see an eye doctor and dentist annually Will check CBC, CMET, Lipid and A1C today  RLQ Pain, Blood in Stool, External Hemorrhoids:  CBC, CMET today RX for Hydrocortisone 25 mg supp BID x 6 days Consider CT abd/pelv pending labs, or if pain is worse   RTC in 1 year, sooner if needed Nicki Reaper, NP This visit occurred during the SARS-CoV-2 public health emergency.  Safety protocols were in place, including screening questions prior to the visit, additional usage of staff PPE, and extensive cleaning of exam room while observing appropriate contact time as indicated for disinfecting solutions.

## 2020-01-20 ENCOUNTER — Encounter: Payer: Self-pay | Admitting: Internal Medicine

## 2020-01-20 DIAGNOSIS — E782 Mixed hyperlipidemia: Secondary | ICD-10-CM

## 2020-01-20 LAB — CBC
HCT: 43.3 % (ref 39.0–52.0)
Hemoglobin: 14.6 g/dL (ref 13.0–17.0)
MCHC: 33.6 g/dL (ref 30.0–36.0)
MCV: 93.9 fl (ref 78.0–100.0)
Platelets: 327 10*3/uL (ref 150.0–400.0)
RBC: 4.61 Mil/uL (ref 4.22–5.81)
RDW: 13.9 % (ref 11.5–15.5)
WBC: 11.5 10*3/uL — ABNORMAL HIGH (ref 4.0–10.5)

## 2020-01-20 LAB — COMPREHENSIVE METABOLIC PANEL
ALT: 30 U/L (ref 0–53)
AST: 26 U/L (ref 0–37)
Albumin: 4.4 g/dL (ref 3.5–5.2)
Alkaline Phosphatase: 95 U/L (ref 39–117)
BUN: 14 mg/dL (ref 6–23)
CO2: 25 mEq/L (ref 19–32)
Calcium: 9.3 mg/dL (ref 8.4–10.5)
Chloride: 104 mEq/L (ref 96–112)
Creatinine, Ser: 0.96 mg/dL (ref 0.40–1.50)
GFR: 85.74 mL/min (ref >60.00)
Glucose, Bld: 83 mg/dL (ref 70–99)
Potassium: 4.3 mEq/L (ref 3.5–5.1)
Sodium: 138 mEq/L (ref 135–145)
Total Bilirubin: 0.5 mg/dL (ref 0.2–1.2)
Total Protein: 7.6 g/dL (ref 6.0–8.3)

## 2020-01-20 LAB — LIPID PANEL
Cholesterol: 224 mg/dL — ABNORMAL HIGH (ref 0–200)
HDL: 37.1 mg/dL — ABNORMAL LOW (ref >39.00)
Total CHOL/HDL Ratio: 6
Triglycerides: 447 mg/dL — ABNORMAL HIGH (ref 0.0–149.0)

## 2020-01-20 LAB — HEMOGLOBIN A1C: Hgb A1c MFr Bld: 6 % (ref 4.6–6.5)

## 2020-01-20 LAB — LDL CHOLESTEROL, DIRECT: Direct LDL: 134 mg/dL

## 2020-01-22 ENCOUNTER — Encounter: Payer: Self-pay | Admitting: Internal Medicine

## 2020-02-10 MED ORDER — ATORVASTATIN CALCIUM 10 MG PO TABS: 10.0000 mg | ORAL_TABLET | Freq: Every day | ORAL | 2 refills | Status: DC

## 2020-02-24 ENCOUNTER — Other Ambulatory Visit: Payer: Self-pay | Admitting: Internal Medicine

## 2020-02-25 NOTE — Telephone Encounter (Signed)
Yes, needs nurse visit for BP check.

## 2020-03-29 ENCOUNTER — Ambulatory Visit: Payer: BC Managed Care – PPO | Admitting: Internal Medicine

## 2020-04-04 ENCOUNTER — Encounter: Payer: Self-pay | Admitting: Internal Medicine

## 2020-04-04 ENCOUNTER — Ambulatory Visit: Payer: BC Managed Care – PPO | Admitting: Internal Medicine

## 2020-04-04 DIAGNOSIS — Z0289 Encounter for other administrative examinations: Secondary | ICD-10-CM

## 2020-05-21 ENCOUNTER — Other Ambulatory Visit: Payer: Self-pay | Admitting: Internal Medicine

## 2020-05-21 DIAGNOSIS — E782 Mixed hyperlipidemia: Secondary | ICD-10-CM

## 2020-06-08 ENCOUNTER — Other Ambulatory Visit: Payer: Self-pay | Admitting: Internal Medicine

## 2020-06-22 ENCOUNTER — Encounter: Payer: Self-pay | Admitting: Internal Medicine

## 2020-06-23 ENCOUNTER — Ambulatory Visit: Payer: BC Managed Care – PPO | Admitting: Internal Medicine

## 2020-06-23 ENCOUNTER — Encounter: Payer: Self-pay | Admitting: Internal Medicine

## 2020-06-23 ENCOUNTER — Other Ambulatory Visit: Payer: Self-pay

## 2020-06-23 DIAGNOSIS — F32A Depression, unspecified: Secondary | ICD-10-CM | POA: Diagnosis not present

## 2020-06-23 DIAGNOSIS — F419 Anxiety disorder, unspecified: Secondary | ICD-10-CM | POA: Insufficient documentation

## 2020-06-23 MED ORDER — SERTRALINE HCL 100 MG PO TABS
200.0000 mg | ORAL_TABLET | Freq: Every day | ORAL | 1 refills | Status: DC
Start: 2020-06-23 — End: 2020-12-15

## 2020-06-23 NOTE — Progress Notes (Signed)
Subjective:    Patient ID: Ivan Mcmahon, male    DOB: Dec 17, 1975, 44 y.o.   MRN: [id number]  HPI  Pt presents to the clinic today with for follow up anxiety and depression. He reports he went to inpatient rehab for alcohol dependence August 2021. He was there for 1 month, started on Sertraline at that visit. He reports general anxiety and depression, denies panic attacks, insomnia, poor appetite or overeating, lack of motivation, sleeping too much or not sleeping at all, hallucinations, paranoia, SI/HI. He is not currently seeing a therapist. He does feel like the Sertraline is helpful but feels like he could benefit from a dose adjustment.   Review of Systems      Past Medical History:  Diagnosis Date  . Hypertension     Current Outpatient Medications  Medication Sig Dispense Refill  . allopurinol (ZYLOPRIM) 100 MG tablet TAKE 1 TABLET BY MOUTH EVERY DAY 90 tablet 1  . atorvastatin (LIPITOR) 10 MG tablet TAKE 1 TABLET BY MOUTH EVERY DAY 30 tablet 0  . fish oil-omega-3 fatty acids 1000 MG capsule Take 1 g by mouth 2 (two) times daily.      . hydrocortisone (ANUSOL-HC) 25 MG suppository Place 1 suppository (25 mg total) rectally 2 (two) times daily. 12 suppository 0  . losartan-hydrochlorothiazide (HYZAAR) 100-25 MG tablet TAKE 1 TABLET BY MOUTH EVERY DAY 90 tablet 0   No current facility-administered medications for this visit.    Allergies  Allergen Reactions  . Sulfa Antibiotics     UNKNOWN REACTION MANY YEARS AGO    Family History  Problem Relation Age of Onset  . Diabetes Father     Social History   Socioeconomic History  . Marital status: Married    Spouse name: Not on file  . Number of children: Not on file  . Years of education: Not on file  . Highest education level: Not on file  Occupational History  . Not on file  Tobacco Use  . Smoking status: Current Every Day Smoker    Packs/day: 0.33    Types: Cigarettes  . Smokeless tobacco: Never Used   Substance and Sexual Activity  . Alcohol use: Yes    Comment: beers every other day  . Drug use: No  . Sexual activity: Not on file  Other Topics Concern  . Not on file  Social History Narrative  . Not on file   Social Determinants of Health   Financial Resource Strain: Not on file  Food Insecurity: Not on file  Transportation Needs: Not on file  Physical Activity: Not on file  Stress: Not on file  Social Connections: Not on file  Intimate Partner Violence: Not on file     Constitutional: Denies fever, malaise, fatigue, headache or abrupt weight changes.  Respiratory: Denies difficulty breathing, shortness of breath, cough or sputum production.   Cardiovascular: Denies chest pain, chest tightness, palpitations or swelling in the hands or feet.  Neurological: Denies dizziness, difficulty with memory, difficulty with speech or problems with balance and coordination.  Psych: Pt reports anxiety and depression. Denies SI/HI.  No other specific complaints in a complete review of systems (except as listed in HPI above).  Objective:   Physical Exam  BP (!) 146/88   Pulse 86   Temp 97.9 F (36.6 C) (Temporal)   Wt 279 lb (126.6 kg)   SpO2 97%   BMI 40.03 kg/m   Wt Readings from Last 3 Encounters:  01/19/20 290 lb (131.5  kg)  12/25/19 291 lb (132 kg)  09/28/19 (!) 301 lb (136.5 kg)    General: Appears his stated age, obese, in NAD. Cardiovascular: Normal rate. Pulmonary/Chest: Normal effort. Neurological: Alert and oriented.  Psychiatric: Mood and affect mildly flat. Behavior is normal. Judgment and thought content normal.     BMET    Component Value Date/Time   NA 138 01/19/2020 1506   K 4.3 01/19/2020 1506   CL 104 01/19/2020 1506   CO2 25 01/19/2020 1506   GLUCOSE 83 01/19/2020 1506   BUN 14 01/19/2020 1506   CREATININE 0.96 01/19/2020 1506   CALCIUM 9.3 01/19/2020 1506    Lipid Panel     Component Value Date/Time   CHOL 224 (H) 01/19/2020 1506    TRIG (H) 01/19/2020 1506    447.0 Triglyceride is over 400; calculations on Lipids are invalid.   HDL 37.10 (L) 01/19/2020 1506   CHOLHDL 6 01/19/2020 1506   VLDL 60.2 (H) 08/17/2019 1205    CBC    Component Value Date/Time   WBC 11.5 (H) 01/19/2020 1506   RBC 4.61 01/19/2020 1506   HGB 14.6 01/19/2020 1506   HCT 43.3 01/19/2020 1506   PLT 327.0 01/19/2020 1506   MCV 93.9 01/19/2020 1506   MCHC 33.6 01/19/2020 1506   RDW 13.9 01/19/2020 1506    Hgb A1C Lab Results  Component Value Date   HGBA1C 6.0 01/19/2020           Assessment & Plan:    Ivan Reaper, NP This visit occurred during the SARS-CoV-2 public health emergency.  Safety protocols were in place, including screening questions prior to the visit, additional usage of staff PPE, and extensive cleaning of exam room while observing appropriate contact time as indicated for disinfecting solutions.

## 2020-06-23 NOTE — Assessment & Plan Note (Signed)
Will increase Sertraline to 200 mg daily Support offered Will continue to monitor

## 2020-06-23 NOTE — Patient Instructions (Signed)
Persistent Depressive Disorder  Persistent depressive disorder (PDD) is a mental health condition. PDD causes symptoms of low-level depression for 2 years or longer. It may also be called long-term (chronic) depression or dysthymia. PDD may include episodes of more severe depression that last for about 2 weeks (major depressive disorder or MDD). PDD can affect the way you think, feel, and sleep. This condition may also affect your relationships. You may be more likely to get sick if you have PDD. Symptoms of PDD occur for most of the day and may include:  Feeling tired (fatigue).  Low energy.  Eating too much or too little.  Sleeping too much or too little.  Feeling restless or agitated.  Feeling hopeless.  Feeling worthless or guilty.  Feeling worried or nervous (anxiety).  Trouble concentrating or making decisions.  Low self-esteem.  A negative way of looking at things (outlook).  Not being able to have fun or feel pleasure.  Avoiding interacting with people.  Getting angry or annoyed easily (irritability).  Acting aggressive or angry. Follow these instructions at home: Activity  Go back to your normal activities as told by your doctor.  Exercise regularly as told by your doctor. General instructions  Take over-the-counter and prescription medicines only as told by your doctor.  Do not drink alcohol. Or, limit how much alcohol you drink to no more than 1 drink a day for nonpregnant women and 2 drinks a day for men. One drink equals 12 oz of beer, 5 oz of wine, or 1 oz of hard liquor. Alcohol can affect any antidepressant medicines you are taking. Talk with your doctor about your alcohol use.  Eat a healthy diet and get plenty of sleep.  Find activities that you enjoy each day.  Consider joining a support group. Your doctor may be able to suggest a support group.  Keep all follow-up visits as told by your doctor. This is important. Where to find more  information National Alliance on Mental Illness  www.nami.org [place] National Institute of Mental Health  www.nimh.nih.gov National Suicide Prevention Lifeline  (1-800-273-8255).  This is free, 24-hour help. Contact a doctor if:  Your symptoms get worse.  You have new symptoms.  You have trouble sleeping or doing your daily activities. Get help right away if:  You self-harm.  You have serious thoughts about hurting yourself or others.  You see, hear, taste, smell, or feel things that are not there (hallucinate). This information is not intended to replace advice given to you by your health care provider. Make sure you discuss any questions you have with your health care provider. Document Revised: 06/14/2017 Document Reviewed: 02/24/2016 Elsevier Patient Education  2020 Elsevier Inc.  

## 2020-07-07 ENCOUNTER — Ambulatory Visit: Payer: BC Managed Care – PPO | Admitting: Internal Medicine

## 2020-07-18 ENCOUNTER — Ambulatory Visit: Payer: BC Managed Care – PPO | Admitting: Internal Medicine

## 2020-07-18 DIAGNOSIS — Z0289 Encounter for other administrative examinations: Secondary | ICD-10-CM

## 2020-09-12 ENCOUNTER — Other Ambulatory Visit: Payer: Self-pay | Admitting: Internal Medicine

## 2020-09-14 ENCOUNTER — Encounter: Payer: Self-pay | Admitting: Internal Medicine

## 2020-09-14 NOTE — Telephone Encounter (Signed)
Patient is wanting to speak to you about his medication. Apparently  Everyone is out of the medicine he needs. Patient would like to discuss this with you about other options. EM

## 2020-09-15 MED ORDER — LOSARTAN POTASSIUM-HCTZ 50-12.5 MG PO TABS: 2.0000 | ORAL_TABLET | Freq: Every day | ORAL | 0 refills | Status: DC

## 2020-09-17 NOTE — Telephone Encounter (Cosign Needed)
Was he able to get this or do I need to find an alternative?

## 2020-12-07 ENCOUNTER — Other Ambulatory Visit: Payer: Self-pay | Admitting: Internal Medicine

## 2020-12-07 NOTE — Telephone Encounter (Signed)
Requested medications are due for refill today.  yes  Requested medications are on the active medications list.  yes  Last refill. 09/15/2020  Future visit scheduled.   no  Notes to clinic.   

## 2020-12-12 ENCOUNTER — Other Ambulatory Visit: Payer: Self-pay | Admitting: Internal Medicine

## 2020-12-14 NOTE — Telephone Encounter (Signed)
Refill request Zoloft Last office visit 06/23/20 Last refill 06/23/20 #180/1 No upcoming appointment scheduled

## 2021-02-03 IMAGING — DX DG FOOT COMPLETE 3+V*L*
3 series · 3 of 3 positions shown · non-contrast
Comparison: None.

CLINICAL DATA: Status post trauma x1 week.

EXAM:
LEFT FOOT - COMPLETE 3+ VIEW

[foot ap]
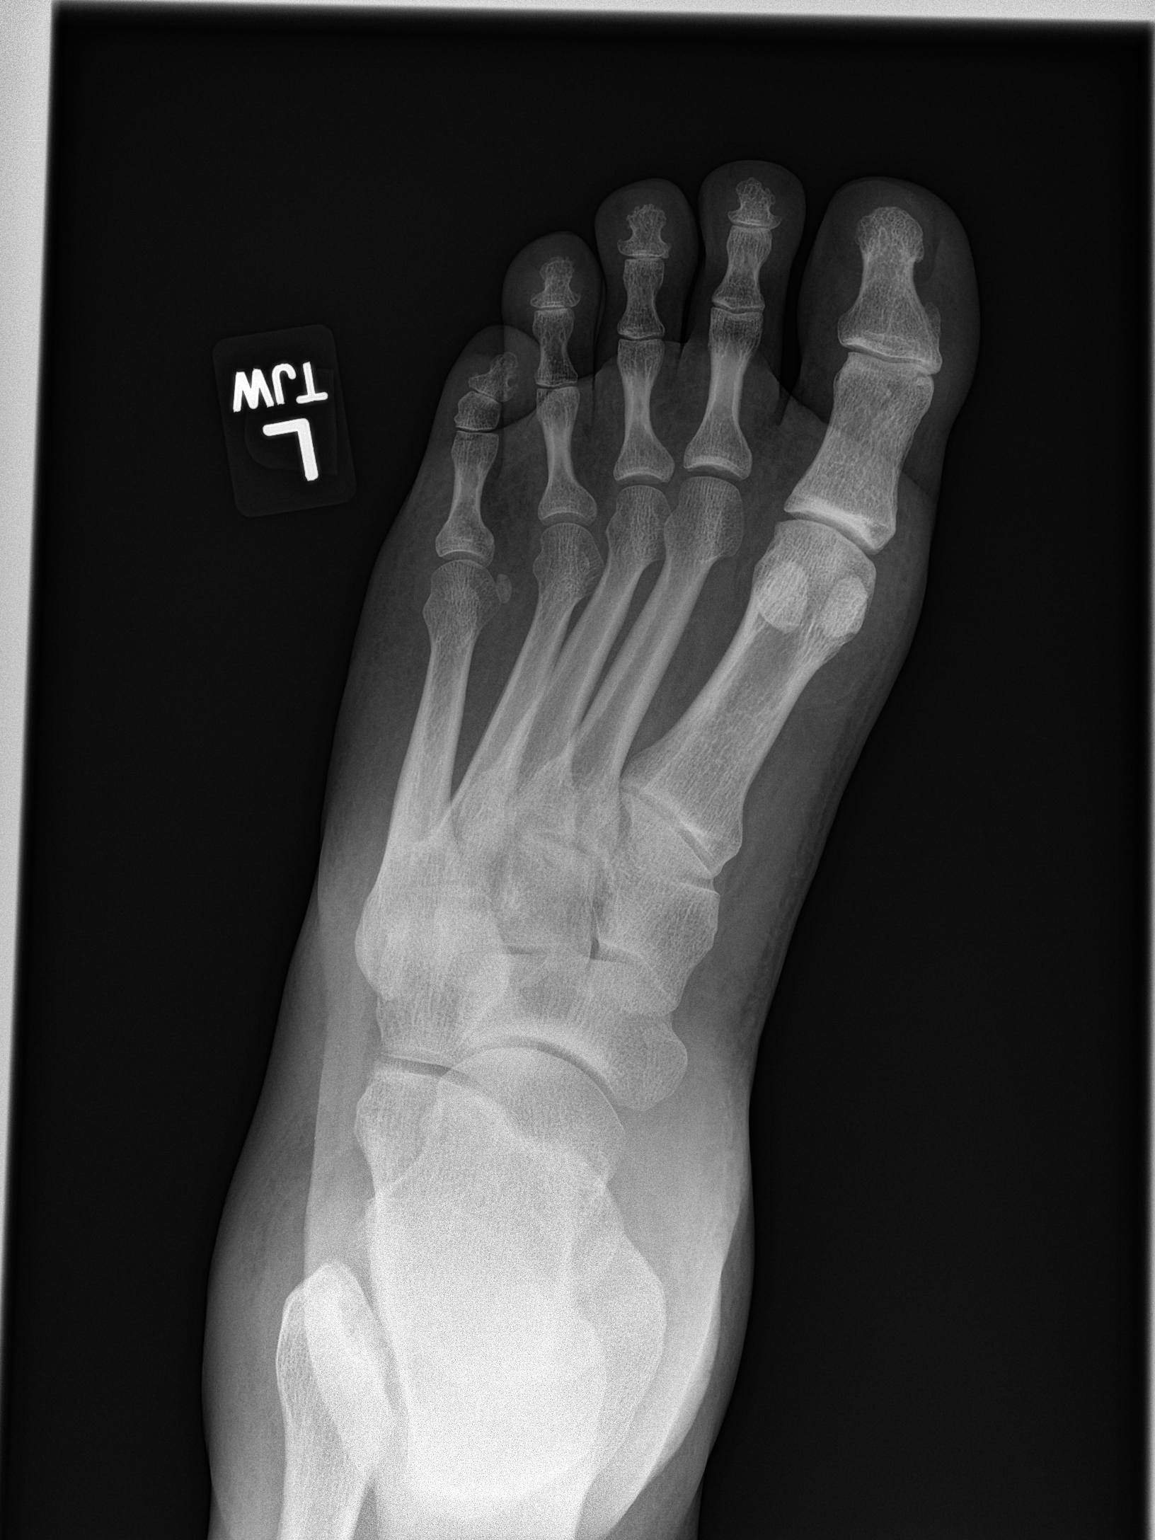

[foot obl]
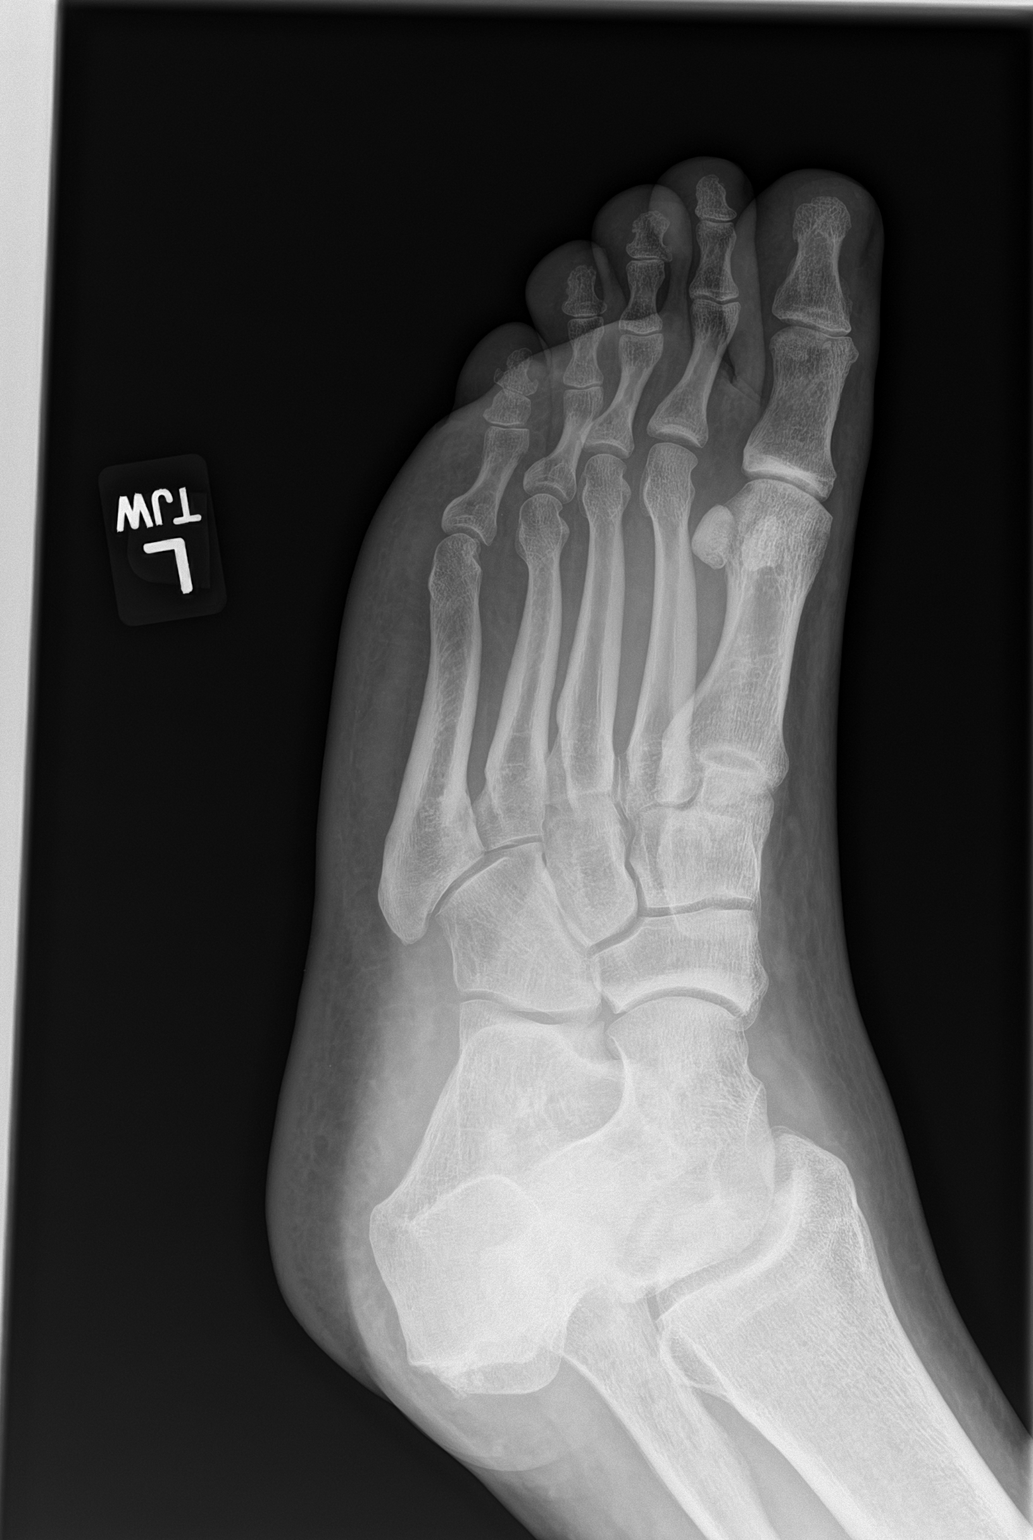

[foot lat]
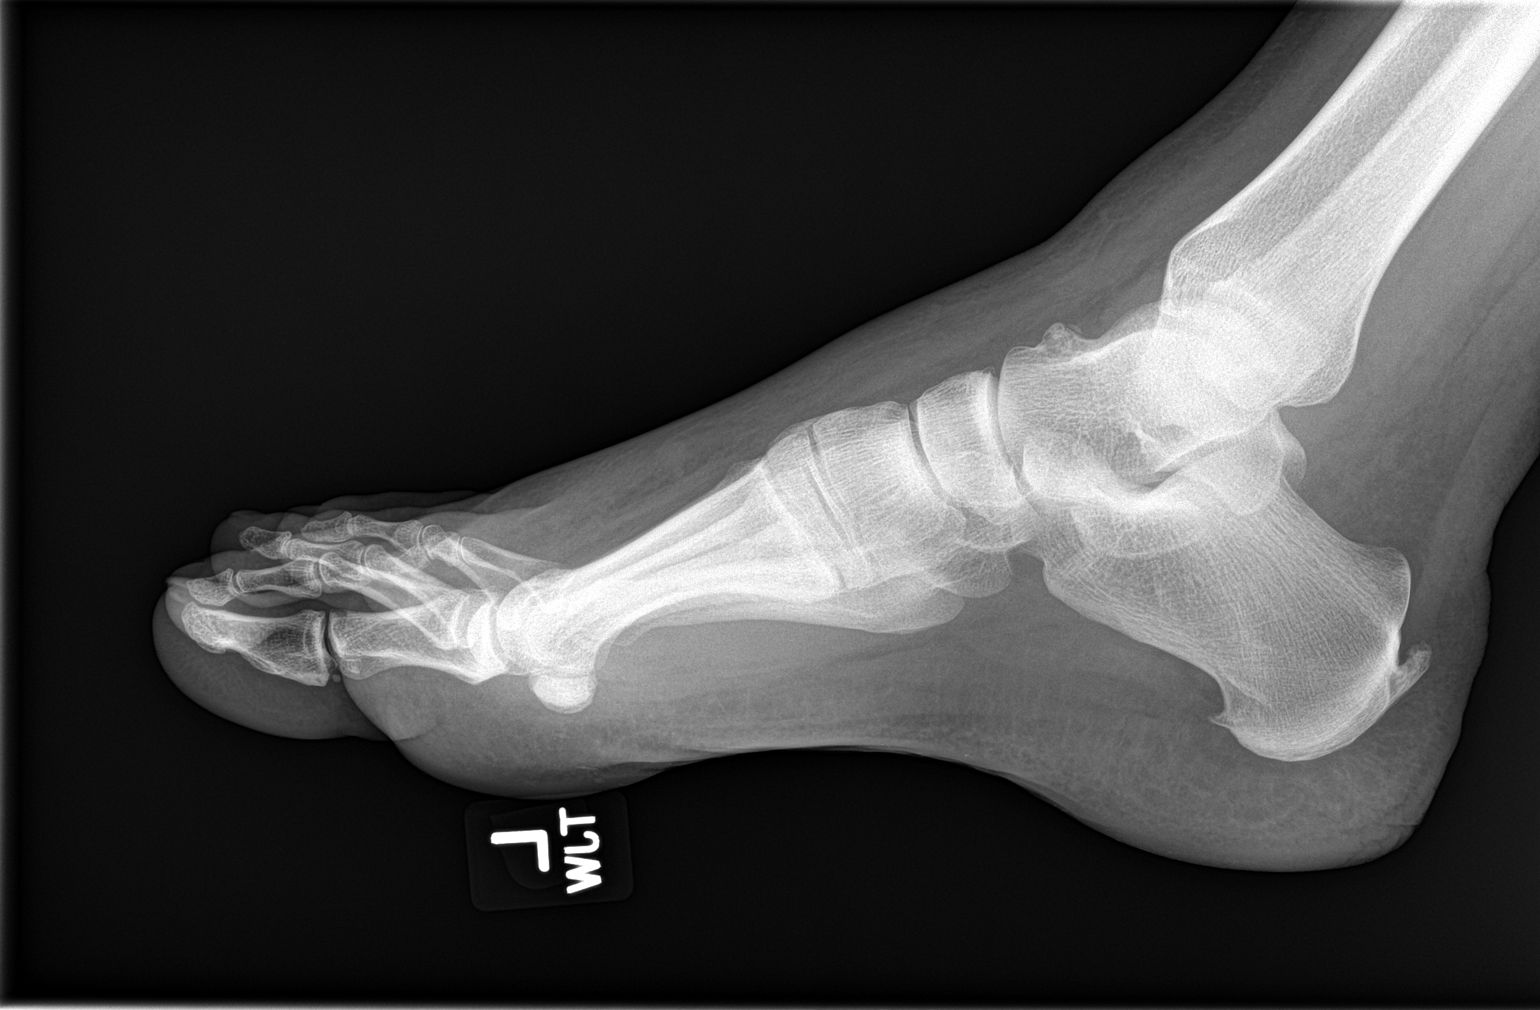

[3 of 3 positions shown; findings below may reference images not displayed]

FINDINGS: There is no evidence of fracture or dislocation. There is no
evidence of arthropathy or other focal bone abnormality. Soft
tissues are unremarkable.
IMPRESSION: Negative.

## 2021-06-19 ENCOUNTER — Encounter: Payer: Self-pay | Admitting: Internal Medicine

## 2021-06-20 NOTE — Telephone Encounter (Signed)
Copied from CRM [id number]. Topic: General - Other >> Jun 19, 2021 10:20 AM Gaetana Michaelis A wrote: Reason for CRM: The patient would like to speak with a member of staff when possible  The patient has recently moved to [place] and would like to coordinate refills of their prescriptions  Please contact further when possible
